# Patient Record
Sex: Male | Born: 1955 | Race: Black or African American | Hispanic: No | Marital: Single | Smoking: Former smoker
Health system: Southern US, Community
[De-identification: ages and names within clinical notes are randomized; demographics above are authoritative.]

## PROBLEM LIST (undated history)

## (undated) DIAGNOSIS — K2100 Gastro-esophageal reflux disease with esophagitis, without bleeding: Secondary | ICD-10-CM

## (undated) DIAGNOSIS — R222 Localized swelling, mass and lump, trunk: Secondary | ICD-10-CM

## (undated) DIAGNOSIS — G35 Multiple sclerosis: Secondary | ICD-10-CM

## (undated) DIAGNOSIS — J189 Pneumonia, unspecified organism: Secondary | ICD-10-CM

## (undated) DIAGNOSIS — I10 Essential (primary) hypertension: Secondary | ICD-10-CM

## (undated) DIAGNOSIS — D649 Anemia, unspecified: Secondary | ICD-10-CM

## (undated) DIAGNOSIS — G35D Multiple sclerosis, unspecified: Secondary | ICD-10-CM

## (undated) DIAGNOSIS — Z22322 Carrier or suspected carrier of Methicillin resistant Staphylococcus aureus: Secondary | ICD-10-CM

## (undated) DIAGNOSIS — R609 Edema, unspecified: Secondary | ICD-10-CM

## (undated) DIAGNOSIS — R042 Hemoptysis: Secondary | ICD-10-CM

## (undated) HISTORY — DX: Anemia, unspecified: D64.9

## (undated) HISTORY — DX: Localized swelling, mass and lump, trunk: R22.2

## (undated) HISTORY — DX: Hemoptysis: R04.2

## (undated) HISTORY — DX: Carrier or suspected carrier of methicillin resistant Staphylococcus aureus: Z22.322

## (undated) HISTORY — DX: Edema, unspecified: R60.9

## (undated) HISTORY — DX: Gastro-esophageal reflux disease with esophagitis: K21.0

## (undated) HISTORY — DX: Gastro-esophageal reflux disease with esophagitis, without bleeding: K21.00

## (undated) HISTORY — DX: Pneumonia, unspecified organism: J18.9

## (undated) HISTORY — PX: OTHER SURGICAL HISTORY: SHX169

## (undated) HISTORY — DX: Essential (primary) hypertension: I10

---

## 2012-08-04 ENCOUNTER — Encounter (HOSPITAL_COMMUNITY): Payer: Self-pay | Admitting: Emergency Medicine

## 2012-08-04 ENCOUNTER — Emergency Department (HOSPITAL_COMMUNITY): Payer: BC Managed Care – PPO

## 2012-08-04 ENCOUNTER — Inpatient Hospital Stay (HOSPITAL_COMMUNITY)
Admission: EM | Admit: 2012-08-04 | Discharge: 2012-08-14 | DRG: 541 | Disposition: A | Discharge status: Skilled Nursing Facility | Payer: BC Managed Care – PPO | Attending: Internal Medicine | Admitting: Internal Medicine

## 2012-08-04 DIAGNOSIS — Z79899 Other long term (current) drug therapy: Secondary | ICD-10-CM

## 2012-08-04 DIAGNOSIS — D473 Essential (hemorrhagic) thrombocythemia: Secondary | ICD-10-CM | POA: Diagnosis present

## 2012-08-04 DIAGNOSIS — D509 Iron deficiency anemia, unspecified: Secondary | ICD-10-CM | POA: Diagnosis present

## 2012-08-04 DIAGNOSIS — G35 Multiple sclerosis: Secondary | ICD-10-CM | POA: Diagnosis present

## 2012-08-04 DIAGNOSIS — Z681 Body mass index (BMI) 19 or less, adult: Secondary | ICD-10-CM

## 2012-08-04 DIAGNOSIS — I959 Hypotension, unspecified: Secondary | ICD-10-CM | POA: Diagnosis present

## 2012-08-04 DIAGNOSIS — R64 Cachexia: Secondary | ICD-10-CM | POA: Diagnosis present

## 2012-08-04 DIAGNOSIS — E871 Hypo-osmolality and hyponatremia: Secondary | ICD-10-CM

## 2012-08-04 DIAGNOSIS — D649 Anemia, unspecified: Secondary | ICD-10-CM

## 2012-08-04 DIAGNOSIS — R05 Cough: Secondary | ICD-10-CM

## 2012-08-04 DIAGNOSIS — J189 Pneumonia, unspecified organism: Principal | ICD-10-CM

## 2012-08-04 DIAGNOSIS — R600 Localized edema: Secondary | ICD-10-CM

## 2012-08-04 DIAGNOSIS — R222 Localized swelling, mass and lump, trunk: Secondary | ICD-10-CM | POA: Diagnosis present

## 2012-08-04 DIAGNOSIS — R042 Hemoptysis: Secondary | ICD-10-CM

## 2012-08-04 DIAGNOSIS — J9600 Acute respiratory failure, unspecified whether with hypoxia or hypercapnia: Secondary | ICD-10-CM | POA: Diagnosis present

## 2012-08-04 DIAGNOSIS — Z87891 Personal history of nicotine dependence: Secondary | ICD-10-CM

## 2012-08-04 DIAGNOSIS — J984 Other disorders of lung: Secondary | ICD-10-CM

## 2012-08-04 DIAGNOSIS — R059 Cough, unspecified: Secondary | ICD-10-CM

## 2012-08-04 DIAGNOSIS — D72829 Elevated white blood cell count, unspecified: Secondary | ICD-10-CM | POA: Diagnosis present

## 2012-08-04 DIAGNOSIS — Y95 Nosocomial condition: Secondary | ICD-10-CM | POA: Diagnosis present

## 2012-08-04 DIAGNOSIS — Z880 Allergy status to penicillin: Secondary | ICD-10-CM

## 2012-08-04 DIAGNOSIS — G35D Multiple sclerosis, unspecified: Secondary | ICD-10-CM

## 2012-08-04 DIAGNOSIS — E8809 Other disorders of plasma-protein metabolism, not elsewhere classified: Secondary | ICD-10-CM | POA: Diagnosis present

## 2012-08-04 HISTORY — DX: Multiple sclerosis, unspecified: G35.D

## 2012-08-04 HISTORY — DX: Multiple sclerosis: G35

## 2012-08-04 NOTE — ED Notes (Signed)
Patient suffered from hyperthermia in January. The patient has had swelling and a pinched nerve to his back since. The patient reports that he has SOB when lying down.

## 2012-08-04 NOTE — ED Provider Notes (Signed)
History     CSN: [id number]  Arrival date & time 08/04/12  2154   First MD Initiated Contact with Patient 08/04/12 2324      Chief Complaint  Patient presents with  . Cough  . Leg Swelling    (Consider location/radiation/quality/duration/timing/severity/associated sxs/prior treatment) HPI Patient presents emergency department complaining of worsening cough over the past several months.  His cough began in December 2013.  He was hospitalized in outside hospital for community acquired pneumonia as well as an MS exacerbation with associated hypothermia.  He was hospitalized for several weeks.  He came up to [facility] to live with his family.  The patient was brought to the emergency department today by his son for worsening cough over the past 24 hours.  Chills without documented fever.  Decreased oral intake over the past several weeks.  Weight loss.  No outside records are available.  Patient also reports that his lower extremities are swollen.  His legs have been swollen for the past several months as well.  Family does not know diagnosis for why this is.   History reviewed. No pertinent past medical history.  History reviewed. No pertinent past surgical history.  History reviewed. No pertinent family history.  History  Substance Use Topics  . Smoking status: Former Smoker    Quit date: 06/06/2012  . Smokeless tobacco: Not on file  . Alcohol Use: No      Review of Systems  All other systems reviewed and are negative.    Allergies  Penicillins  Home Medications   Current Outpatient Rx  Name  Route  Sig  Dispense  Refill  . amLODipine (NORVASC) 5 MG tablet   Oral   Take 5 mg by mouth daily.         . furosemide (LASIX) 20 MG tablet   Oral   Take 20 mg by mouth 2 (two) times daily.         . iron polysaccharides (NIFEREX) 150 MG capsule   Oral   Take 150 mg by mouth 2 (two) times daily.         . pantoprazole (PROTONIX) 40 MG tablet   Oral   Take 40  mg by mouth daily.         . potassium chloride (K-DUR) 10 MEQ tablet   Oral   Take 10 mEq by mouth 2 (two) times daily.           BP 112/75  Pulse 121  Temp(Src) 98.9 F (37.2 C) (Oral)  Resp 25  SpO2 91%  Physical Exam  Nursing note and vitals reviewed. Constitutional: He is oriented to person, place, and time. He appears well-developed and well-nourished.  Cachectic  HENT:  Head: Normocephalic and atraumatic.  Eyes: EOM are normal.  Neck: Normal range of motion.  Cardiovascular: Normal rate, regular rhythm, normal heart sounds and intact distal pulses.   Pulmonary/Chest: Effort normal and breath sounds normal. No respiratory distress.  Abdominal: Soft. He exhibits no distension. There is no tenderness.  Musculoskeletal: Normal range of motion.  3+ bilateral lower extremity edema without tenderness or erythema  Neurological: He is alert and oriented to person, place, and time.  Skin: Skin is warm and dry.  Psychiatric: He has a normal mood and affect. Judgment normal.    ED Course  Procedures (including critical care time)   Date: 08/04/2012  Rate: 115  Rhythm:sinus tachycardia  QRS Axis: normal  Intervals: normal  ST/T Wave abnormalities: normal  Conduction Disutrbances: none  Narrative Interpretation:   Old EKG Reviewed: No significant changes noted     Labs Reviewed  BASIC METABOLIC PANEL - Abnormal; Notable for the following:    Sodium 131 (*)    Potassium 5.7 (*)    Chloride 92 (*)    Glucose, Bld 129 (*)    All other components within normal limits  CBC - Abnormal; Notable for the following:    WBC 12.9 (*)    RBC 3.76 (*)    Hemoglobin 8.7 (*)    HCT 29.1 (*)    MCV 77.4 (*)    MCH 23.1 (*)    MCHC 29.9 (*)    RDW 18.0 (*)    Platelets 587 (*)    All other components within normal limits  PRO B NATRIURETIC PEPTIDE - Abnormal; Notable for the following:    Pro B Natriuretic peptide (BNP) 377.7 (*)    All other components within normal  limits  CULTURE, BLOOD (ROUTINE X 2)  CULTURE, BLOOD (ROUTINE X 2)  TROPONIN I  VITAMIN B12  FOLATE  IRON AND TIBC  FERRITIN  RETICULOCYTES  URINALYSIS, ROUTINE W REFLEX MICROSCOPIC   Dg Chest 2 View (if Patient Has Fever And/or Copd)  08/04/2012  *RADIOLOGY REPORT*  Clinical Data: Cough, congestion, leg swelling.  CHEST - 2 VIEW  Comparison: None.  Findings: There is a large opacity in the left lower lung probably representing consolidation due to pneumonia.  Underlying mass lesion is not excluded and follow up after resolution of acute symptoms is recommended.  The heart size appears normal but is obscured by the left parenchymal process.  The right lung appears clear and expanded.  No blunting of costophrenic angles.  No pneumothorax. Scarring and emphysematous changes in the lung apices.  IMPRESSION: Large opacity in the left lower lung likely to represent consolidation due to pneumonia.  Follow up after resolution of acute symptoms is recommended to exclude underlying mass lesion.   Original Report Authenticated By: Burman Nieves, M.D.    I personally reviewed the imaging tests through PACS system I reviewed available ER/hospitalization records through the EMR    1. HCAP (healthcare-associated pneumonia)   2. Cough       MDM  Concern is what appears to be a healthcare associated pneumonia as well as what appears to be a left lung mass.  The patient will get a CT scan this time.  Admission the hospital.  His hemoglobin is 8.7.  As best I can tell the patient's been anemic before as he is on iron supplementation at home.  We have no prior labs on this patient.  The patient was treated for community acquired pneumonia in January.  Given the finding today this will represent healthcare associated pneumonia.  His heart rate on arrival was 121.  His potassium is 5.7 with normal BUN and creatinine.  I suspect this is a small cyst.  Repeat potassium pending.      [phone] and  [phone] Markus Casten) [phone] (Dyron Hodges- Son)  Lyanne Co, MD 08/05/12 [phone]

## 2012-08-05 ENCOUNTER — Emergency Department (HOSPITAL_COMMUNITY): Payer: BC Managed Care – PPO

## 2012-08-05 ENCOUNTER — Encounter (HOSPITAL_COMMUNITY): Payer: Self-pay

## 2012-08-05 DIAGNOSIS — R55 Syncope and collapse: Secondary | ICD-10-CM

## 2012-08-05 DIAGNOSIS — J189 Pneumonia, unspecified organism: Principal | ICD-10-CM

## 2012-08-05 DIAGNOSIS — R609 Edema, unspecified: Secondary | ICD-10-CM

## 2012-08-05 DIAGNOSIS — E871 Hypo-osmolality and hyponatremia: Secondary | ICD-10-CM | POA: Diagnosis present

## 2012-08-05 DIAGNOSIS — R600 Localized edema: Secondary | ICD-10-CM | POA: Diagnosis present

## 2012-08-05 DIAGNOSIS — G35 Multiple sclerosis: Secondary | ICD-10-CM | POA: Diagnosis present

## 2012-08-05 DIAGNOSIS — R042 Hemoptysis: Secondary | ICD-10-CM | POA: Diagnosis present

## 2012-08-05 DIAGNOSIS — R05 Cough: Secondary | ICD-10-CM

## 2012-08-05 DIAGNOSIS — R059 Cough, unspecified: Secondary | ICD-10-CM

## 2012-08-05 DIAGNOSIS — D649 Anemia, unspecified: Secondary | ICD-10-CM

## 2012-08-05 LAB — BASIC METABOLIC PANEL
CO2: 28 mEq/L (ref 19–32)
Glucose, Bld: 129 mg/dL — ABNORMAL HIGH (ref 70–99)
Potassium: 5.7 mEq/L — ABNORMAL HIGH (ref 3.5–5.1)
Sodium: 131 mEq/L — ABNORMAL LOW (ref 135–145)

## 2012-08-05 LAB — COMPREHENSIVE METABOLIC PANEL
AST: 8 U/L (ref 0–37)
Albumin: 1.8 g/dL — ABNORMAL LOW (ref 3.5–5.2)
Alkaline Phosphatase: 91 U/L (ref 39–117)
CO2: 28 mEq/L (ref 19–32)
Chloride: 95 mEq/L — ABNORMAL LOW (ref 96–112)
Potassium: 3.7 mEq/L (ref 3.5–5.1)
Total Bilirubin: 0.3 mg/dL (ref 0.3–1.2)

## 2012-08-05 LAB — CBC WITH DIFFERENTIAL/PLATELET
Basophils Absolute: 0 10*3/uL (ref 0.0–0.1)
Basophils Relative: 0 % (ref 0–1)
Eosinophils Absolute: 0 10*3/uL (ref 0.0–0.7)
Eosinophils Relative: 0 % (ref 0–5)
MCH: 23.4 pg — ABNORMAL LOW (ref 26.0–34.0)
MCHC: 29.8 g/dL — ABNORMAL LOW (ref 30.0–36.0)
MCV: 78.5 fL (ref 78.0–100.0)
Platelets: 646 10*3/uL — ABNORMAL HIGH (ref 150–400)
RDW: 17.8 % — ABNORMAL HIGH (ref 11.5–15.5)

## 2012-08-05 LAB — IRON AND TIBC
Iron: <10 ug/dL — ABNORMAL LOW (ref 42–135)
UIBC: 161 ug/dL (ref 125–400)

## 2012-08-05 LAB — EXPECTORATED SPUTUM ASSESSMENT W GRAM STAIN, RFLX TO RESP C: Special Requests: NORMAL

## 2012-08-05 LAB — CBC
HCT: 29.1 % — ABNORMAL LOW (ref 39.0–52.0)
Hemoglobin: 8.7 g/dL — ABNORMAL LOW (ref 13.0–17.0)
MCH: 23.1 pg — ABNORMAL LOW (ref 26.0–34.0)
MCV: 77.4 fL — ABNORMAL LOW (ref 78.0–100.0)
RBC: 3.76 MIL/uL — ABNORMAL LOW (ref 4.22–5.81)

## 2012-08-05 LAB — URINALYSIS, ROUTINE W REFLEX MICROSCOPIC
Bilirubin Urine: NEGATIVE
Glucose, UA: NEGATIVE mg/dL
Ketones, ur: NEGATIVE mg/dL
Leukocytes, UA: NEGATIVE
Specific Gravity, Urine: >1.046 — ABNORMAL HIGH (ref 1.005–1.030)
pH: 6 (ref 5.0–8.0)

## 2012-08-05 LAB — CREATININE, URINE, RANDOM: Creatinine, Urine: 176.56 mg/dL

## 2012-08-05 LAB — BILIRUBIN, DIRECT: Bilirubin, Direct: 0.1 mg/dL (ref 0.0–0.3)

## 2012-08-05 LAB — STREP PNEUMONIAE URINARY ANTIGEN: Strep Pneumo Urinary Antigen: NEGATIVE

## 2012-08-05 LAB — FERRITIN: Ferritin: 737 ng/mL — ABNORMAL HIGH (ref 22–322)

## 2012-08-05 LAB — HIV ANTIBODY (ROUTINE TESTING W REFLEX): HIV: NONREACTIVE

## 2012-08-05 MED ORDER — DEXTROSE 5 % IV SOLN
500.0000 mg | Freq: Once | INTRAVENOUS | Status: AC
  Administered 2012-08-05: 500 mg via INTRAVENOUS
  Filled 2012-08-05: qty 500

## 2012-08-05 MED ORDER — ENOXAPARIN SODIUM 40 MG/0.4ML ~~LOC~~ SOLN
40.0000 mg | SUBCUTANEOUS | Status: DC
  Administered 2012-08-06 – 2012-08-10 (×6): 40 mg via SUBCUTANEOUS
  Filled 2012-08-05 (×11): qty 0.4

## 2012-08-05 MED ORDER — ENOXAPARIN SODIUM 40 MG/0.4ML ~~LOC~~ SOLN
40.0000 mg | SUBCUTANEOUS | Status: DC
  Filled 2012-08-05: qty 0.4

## 2012-08-05 MED ORDER — VANCOMYCIN HCL IN DEXTROSE 1-5 GM/200ML-% IV SOLN
1000.0000 mg | Freq: Two times a day (BID) | INTRAVENOUS | Status: DC
  Administered 2012-08-05 – 2012-08-08 (×6): 1000 mg via INTRAVENOUS
  Filled 2012-08-05 (×8): qty 200

## 2012-08-05 MED ORDER — BENZONATATE 100 MG PO CAPS
200.0000 mg | ORAL_CAPSULE | Freq: Three times a day (TID) | ORAL | Status: DC
  Administered 2012-08-05 – 2012-08-08 (×9): 200 mg via ORAL
  Filled 2012-08-05 (×13): qty 2

## 2012-08-05 MED ORDER — PIPERACILLIN-TAZOBACTAM 3.375 G IVPB
3.3750 g | Freq: Three times a day (TID) | INTRAVENOUS | Status: DC
  Administered 2012-08-05 – 2012-08-12 (×23): 3.375 g via INTRAVENOUS
  Filled 2012-08-05 (×25): qty 50

## 2012-08-05 MED ORDER — POLYSACCHARIDE IRON COMPLEX 150 MG PO CAPS
150.0000 mg | ORAL_CAPSULE | Freq: Two times a day (BID) | ORAL | Status: DC
  Administered 2012-08-05 – 2012-08-14 (×18): 150 mg via ORAL
  Filled 2012-08-05 (×22): qty 1

## 2012-08-05 MED ORDER — ADULT MULTIVITAMIN W/MINERALS CH
1.0000 | ORAL_TABLET | Freq: Every day | ORAL | Status: DC
  Administered 2012-08-05: 1 via ORAL
  Filled 2012-08-05 (×2): qty 1

## 2012-08-05 MED ORDER — DM-GUAIFENESIN ER 30-600 MG PO TB12
1.0000 | ORAL_TABLET | Freq: Two times a day (BID) | ORAL | Status: DC
  Administered 2012-08-05 – 2012-08-08 (×5): 1 via ORAL
  Filled 2012-08-05 (×9): qty 1

## 2012-08-05 MED ORDER — PIPERACILLIN-TAZOBACTAM 3.375 G IVPB
3.3750 g | Freq: Once | INTRAVENOUS | Status: AC
  Administered 2012-08-05: 3.375 g via INTRAVENOUS
  Filled 2012-08-05: qty 50

## 2012-08-05 MED ORDER — IOHEXOL 350 MG/ML SOLN
100.0000 mL | Freq: Once | INTRAVENOUS | Status: AC | PRN
Start: 2012-08-05 — End: 2012-08-05
  Administered 2012-08-05: 100 mL via INTRAVENOUS

## 2012-08-05 MED ORDER — AZITHROMYCIN 250 MG PO TABS
250.0000 mg | ORAL_TABLET | Freq: Every day | ORAL | Status: DC
  Filled 2012-08-05: qty 1

## 2012-08-05 MED ORDER — ENSURE COMPLETE PO LIQD
237.0000 mL | Freq: Three times a day (TID) | ORAL | Status: DC
  Administered 2012-08-05 – 2012-08-14 (×20): 237 mL via ORAL

## 2012-08-05 MED ORDER — VANCOMYCIN HCL IN DEXTROSE 1-5 GM/200ML-% IV SOLN
1000.0000 mg | Freq: Once | INTRAVENOUS | Status: AC
  Administered 2012-08-05: 1000 mg via INTRAVENOUS
  Filled 2012-08-05: qty 200

## 2012-08-05 MED ORDER — ALBUTEROL SULFATE (5 MG/ML) 0.5% IN NEBU
5.0000 mg | INHALATION_SOLUTION | Freq: Once | RESPIRATORY_TRACT | Status: AC
  Administered 2012-08-05: 5 mg via RESPIRATORY_TRACT
  Filled 2012-08-05: qty 1

## 2012-08-05 MED ORDER — PANTOPRAZOLE SODIUM 40 MG PO TBEC
40.0000 mg | DELAYED_RELEASE_TABLET | Freq: Every day | ORAL | Status: DC
  Administered 2012-08-05: 40 mg via ORAL
  Filled 2012-08-05 (×2): qty 1

## 2012-08-05 NOTE — Progress Notes (Signed)
Pt with crackles noted vs taken and pt assist to chair. Dr Malachi Bonds informed and new orders noted.

## 2012-08-05 NOTE — Progress Notes (Addendum)
[facility] PROGRESS NOTE  Treyveon Mochizuki UJW:[id number] DOB: May 15, 1955 DOA: 08/04/2012 PCP: No primary provider on file.  Mr. Slomski is a 57 yo M with MS who presents with cough and a 50-lb weight loss.  CT chest demonstrated a large left lung mass and PNA.  He was seen by PCCM and will undergo bronchoscopic biopsy on 4/2.    Assessment/Plan  Acute hypoxic respiratory failure with SOB:  Likely a combination of enlarging mass and/or pneumonia.  Was started on broad spectrum antibiotics and nasal canula overnight and feeling somewhat better this morning.  CT neg for PE.  Health care associated pneumonia (hospital-acquired pneumonia) vs. Post-obstructive pneumonia -  Continue vancomycin and zosyn -  Add atypical coverage with azithromycin -  F/u urine legionella and s. pneumo ag -  Sputum culture -  F/u blood cultures  Lung mass, large and without significant mediastinal/hilar LAD.  Patient aware that he may have cancer.   -  Pulmonary consult -  Patient ate breakfast this morning but has subsequently been made NPO.    Leg edema, severe, likely due to hypoalbuminemia 2/2 malignancy-associated wasting, but in setting of malignancy, agree with ruling out DVT -  Nutrition consult when starting diet again -  Dietary supplements  -  ECHO pending -  UA for proteinuria -  Lower extremity duplex -  Elevate lower extremities -  TED hose when able -  Add SCDs in the mean time  Hyponatremia - May be mildly intravascularly deplete or have some heart failure, however, I suspect he may have some SIADH from his pulmonary issues -  ECHO  -  LFTs wnl -  TSH pending -  Gentle hydration -  Urine osms and sodium  Leukocytosis:  May be due to malignancy or pneumonia.  Trending down -  Trend  Microcytic anemia -  F/u anemia panel  -  Hemoccult stool   Thrombocytosis:  May be due to malignancy or pneumonia.  Trending up -  Trend  Multiple sclerosis - patient unable to provide much  history regarding this. -  Will obtain more information from family  Diet:  NPO (ate breakfast this morning) Access:  PIV IVF:  OFF Proph:  lovenox to start post-procedure, SCDs now  Code Status: full code Family Communication: spoke with patient alone Disposition Plan: pending medically stable.  PT consult prior to discharge.     Consultants:  pulmonary  Procedures:  CTa chest  Antibiotics:  Vancomycin 4/1 >>  Zosyn 4/1 >>  Azithro 4/1 >>   HPI/Subjective:  Denies fevers, chills, headache.  Denies chest pain.  SOB is better since starting oxygen.  Has not been exerting himself to get OOB this morning.  Denies nausea, vomiting, diarrhea,constipation.  Denies pain.  Has some cough not productive of blood currently.  Objective: Filed Vitals:   08/05/12 0115 08/05/12 0211 08/05/12 0238 08/05/12 0542  BP: 105/67 105/62 95/63 99/65   Pulse: 107 91 107 92  Temp:   98.1 F (36.7 C) 98.2 F (36.8 C)  TempSrc:   Oral Oral  Resp:  18 18 18   Height:   6\' 1"  (1.854 m)   Weight:   59.875 kg (132 lb)   SpO2: 91% 96% 95% 100%    Intake/Output Summary (Last 24 hours) at 08/05/12 0927 Last data filed at 08/05/12 0900  Gross per 24 hour  Intake    240 ml  Output    250 ml  Net    -10 ml   Ceasar Mons  Weights   08/05/12 0238  Weight: 59.875 kg (132 lb)    Exam:   General:  [name], No acute distress  HEENT:  NCAT, MMM  Cardiovascular:  RRR, nl S1, S2 no mrg, 2+ pulses, warm extremities  Respiratory:  Diminished BS at the left base to the left mid-back.  CTA on the right.  No wheezes, rales, or rhonchi.  No increased WOB while on nasal canula.   Abdomen:   NABS, soft, NT/ND  MSK:   Normal tone and bulk, 3+ bilateral ankle edema.    Neuro:  Grossly intact  Data Reviewed: Basic Metabolic Panel:  Recent Labs Lab 08/04/12 2349 08/05/12 0130 08/05/12 0515  NA 131*  --  134*  K 5.7* 3.8 3.7  CL 92*  --  95*  CO2 28  --  28  GLUCOSE 129*  --  109*  BUN 14   --  11  CREATININE 0.54  --  0.63  CALCIUM 8.8  --  8.6   Liver Function Tests:  Recent Labs Lab 08/05/12 0515  AST 8  ALT <5  ALKPHOS 91  BILITOT 0.3  PROT 6.8  ALBUMIN 1.8*   No results found for this basename: LIPASE, AMYLASE,  in the last 168 hours No results found for this basename: AMMONIA,  in the last 168 hours CBC:  Recent Labs Lab 08/04/12 2349 08/05/12 0515  WBC 12.9* 12.3*  NEUTROABS  --  9.4*  HGB 8.7* 8.7*  HCT 29.1* 29.2*  MCV 77.4* 78.5  PLT 587* 646*   Cardiac Enzymes:  Recent Labs Lab 08/05/12 0130  TROPONINI <0.30   BNP (last 3 results)  Recent Labs  08/04/12 2349  PROBNP 377.7*   CBG: No results found for this basename: GLUCAP,  in the last 168 hours  No results found for this or any previous visit (from the past 240 hour(s)).   Studies: Dg Chest 2 View (if Patient Has Fever And/or Copd)  08/04/2012  *RADIOLOGY REPORT*  Clinical Data: Cough, congestion, leg swelling.  CHEST - 2 VIEW  Comparison: None.  Findings: There is a large opacity in the left lower lung probably representing consolidation due to pneumonia.  Underlying mass lesion is not excluded and follow up after resolution of acute symptoms is recommended.  The heart size appears normal but is obscured by the left parenchymal process.  The right lung appears clear and expanded.  No blunting of costophrenic angles.  No pneumothorax. Scarring and emphysematous changes in the lung apices.  IMPRESSION: Large opacity in the left lower lung likely to represent consolidation due to pneumonia.  Follow up after resolution of acute symptoms is recommended to exclude underlying mass lesion.   Original Report Authenticated By: Burman Nieves, M.D.    Ct Angio Chest W/cm &/or Wo Cm  08/05/2012  *RADIOLOGY REPORT*  Clinical Data: Leg swelling  CT ANGIOGRAPHY CHEST  Technique:  Multidetector CT imaging of the chest using the standard protocol during bolus administration of intravenous contrast.  Multiplanar reconstructed images including MIPs were obtained and reviewed to evaluate the vascular anatomy.  Contrast:  100 ml Omnipaque 350  Comparison: None.  Findings: There are no filling defects in the pulmonary arterial tree to suggest acute pulmonary thromboembolism.  There is mass-like consolidation extending from the left inferior hilum towards the left lung base.  There is abrupt occlusion of the left lower lobe airways either due to endobronchial mass or mucous plugging.  There is a large 10.1 x 6.6 by  8.9 cm rounded fluid collection within the left lower lobe.  There is scattered airspace disease in the right middle lobe, right lower lobe, and left upper lobe.  Bronchiectasis at the right lung base is noted.  There is no abnormal mediastinal adenopathy by measurement criteria.  Sub centimeter Antaniya Venuti axis diameter nodes are noted.  The patient is cachectic.  Bullous changes at the lung apices are noted.  No destructive bone lesion.  The liver is diffusely heterogeneous in enhancement.  Moderate left pleural effusion is present.  T8 superior end plate depression fracture is of indeterminate age.  IMPRESSION: No evidence of acute pulmonary thromboembolism.  Mass-like consolidation in the left lower lobe.  Left lower lobe air placed abruptly and either due to mucous plugging or endobronchial lesion.  Bronchoscopy may be helpful.  Large loculated fluid collection in the left lower lobe of unknown significance.  Patchy consolidation throughout the other lobes.  Left pleural effusion.  Heterogeneous enhancement in the liver which may simply be due to phase of contrast.  Diffuse infiltration with tumor cannot be excluded.  Mild superior T8 compression fracture of indeterminate age.   Original Report Authenticated By: Jolaine Click, M.D.     Scheduled Meds: . enoxaparin (LOVENOX) injection  40 mg Subcutaneous Q24H  . iron polysaccharides  150 mg Oral BID  . pantoprazole  40 mg Oral Daily  .  piperacillin-tazobactam (ZOSYN)  IV  3.375 g Intravenous Q8H  . vancomycin  1,000 mg Intravenous Q12H   Continuous Infusions:   Active Problems:   Leg edema   Hyponatremia   Anemia   HAP (hospital-acquired pneumonia)   Multiple sclerosis   Hemoptysis    Time spent: 30 min    Raziyah Vanvleck, [facility]  [facility] Pager [phone]. If 7PM-7AM, please contact night-coverage at [email or web address], password [facility] 08/05/2012, 9:27 AM  LOS: 1 day

## 2012-08-05 NOTE — ED Notes (Signed)
Pt is aware of the need for urine, however states he is unable at this time. Urinal at bedside.

## 2012-08-05 NOTE — Progress Notes (Signed)
Rx Brief note Pt PCN allergic unknown rxn from 1960s.  Spoke with RN Leotis Shames- she spoke with Dr Patria Mane he wants to proceed with Zosyn based on allergy info.  Told Lauren to watch pt carefully.  Lorenza Evangelist 08/05/2012 12:25 AM

## 2012-08-05 NOTE — H&P (Signed)
PCP: none   Chief Complaint:   cough  HPI: Devon Glenn is a 57 y.o. male   has a past medical history of Multiple sclerosis.   Presented with  Patient have been coughing for the past 2-3 weeks that is non-productive. He has had some weight loss 50 LB weight loss. He has been recently hospitalized due to PNA and hypothermia in [place]. Per ER son told him that he have had a thoracentesis while at the hospital.  Now moved to live with is son. He reported last week he had a small amount of blood in his sputum. Denies any fever but her have had some chills. No night sweats. He never been homeless. Denies any exposure to TB no significant travel hx. He has had extensive smoking history but states currently ha quit. CXR is worrisome for PNA vs Mass.  Denies any chest pain or shortness of breath. Per family he had had leg swelling for the past 3 months since December that has been unchanged. He denies any history of heart failure or liver disease. CT of the chest has been ordered and hospitalist was called on admission. ER have spoken to the family and the patient about the possibility that there is a mass in his chest.  The patient has trouble to me detailed history no family is at bedside.  Review of Systems:     Pertinent positives include: chills, fatigue, weight loss non-productive cough, coughing up of blood.  Constitutional:  No weight loss, night sweats, Fevers,  HEENT:  No headaches, Difficulty swallowing,Tooth/dental problems,Sore throat,  No sneezing, itching, ear ache, nasal congestion, post nasal drip,  Cardio-vascular:  No chest pain, Orthopnea, PND, anasarca, dizziness, palpitations.no Bilateral lower extremity swelling  GI:  No heartburn, indigestion, abdominal pain, nausea, vomiting, diarrhea, change in bowel habits, loss of appetite, melena, blood in stool, hematemesis Resp:  no shortness of breath at rest. No dyspnea on exertion, No excess mucus, no productive  cough,   No change in color of mucus.No wheezing. Skin:  no rash or lesions. No jaundice GU:  no dysuria, change in color of urine, no urgency or frequency. No straining to urinate.  No flank pain.  Musculoskeletal:  No joint pain or no joint swelling. No decreased range of motion. No back pain.  Psych:  No change in mood or affect. No depression or anxiety. No memory loss.  Neuro: no localizing neurological complaints, no tingling, no weakness, no double vision, no gait abnormality, no slurred speech, no confusion  Otherwise ROS are negative except for above, 10 systems were reviewed  Past Medical History: Past Medical History  Diagnosis Date  . Multiple sclerosis    Past Surgical History  Procedure Laterality Date  . Lipoma removal       Medications: Prior to Admission medications   Medication Sig Start Date End Date Taking? Authorizing Provider  amLODipine (NORVASC) 5 MG tablet Take 5 mg by mouth daily.   Yes Historical Provider, MD  furosemide (LASIX) 20 MG tablet Take 20 mg by mouth 2 (two) times daily.   Yes Historical Provider, MD  iron polysaccharides (NIFEREX) 150 MG capsule Take 150 mg by mouth 2 (two) times daily.   Yes Historical Provider, MD  pantoprazole (PROTONIX) 40 MG tablet Take 40 mg by mouth daily.   Yes Historical Provider, MD  potassium chloride (K-DUR) 10 MEQ tablet Take 10 mEq by mouth 2 (two) times daily.   Yes Historical Provider, MD    Allergies:  Allergies  Allergen Reactions  . Penicillins     unknown    Social History:  Ambulatory independently   Lives at  Home with son [phone]   reports that he quit smoking about 1 months ago. He does not have any smokeless tobacco history on file. He reports that he does not drink alcohol or use illicit drugs.   Family History: family history includes Breast cancer in his mother and sister and Cancer in his father.    Physical Exam: Patient Vitals for the past 24 hrs:  BP Temp Temp src  Pulse Resp SpO2  08/04/12 2243 112/75 mmHg 98.9 F (37.2 C) Oral 121 25 91 %    1. General:  in No Acute distress cachectic appearing 2. Psychological: Alert and  Oriented 3. Head/ENT:   Dry Mucous Membranes                          Head Non traumatic, neck supple                          Normal Dentition 4. SKIN:   decreased Skin turgor,  Skin clean Dry and intact no rash 5. Heart: Rapid but Regular rate and rhythm no Murmur, Rub or gallop 6. Lungs: Occasional wheezes extensive crackles and coarse breath sounds constant cough is interfering with exam  7. Abdomen: Soft, non-tender, Non distended thin , normal bowel sounds present 8. Lower extremities: no clubbing, cyanosis, extensive 3+ edema below the knees  9. Neurologically Grossly intact, moving all 4 extremities equally 10. MSK: Normal range of motion  body mass index is unknown because there is no height or weight on file.   Labs on Admission:   Recent Labs  08/04/12 2349  NA 131*  K 5.7*  CL 92*  CO2 28  GLUCOSE 129*  BUN 14  CREATININE 0.54  CALCIUM 8.8   No results found for this basename: AST, ALT, ALKPHOS, BILITOT, PROT, ALBUMIN,  in the last 72 hours No results found for this basename: LIPASE, AMYLASE,  in the last 72 hours  Recent Labs  08/04/12 2349  WBC 12.9*  HGB 8.7*  HCT 29.1*  MCV 77.4*  PLT 587*   No results found for this basename: CKTOTAL, CKMB, CKMBINDEX, TROPONINI,  in the last 72 hours No results found for this basename: TSH, T4TOTAL, FREET3, T3FREE, THYROIDAB,  in the last 72 hours No results found for this basename: VITAMINB12, FOLATE, FERRITIN, TIBC, IRON, RETICCTPCT,  in the last 72 hours No results found for this basename: HGBA1C    CrCl is unknown because there is no height on file for the current visit. ABG No results found for this basename: phart, pco2, po2, hco3, tco2, acidbasedef, o2sat     No results found for this basename: DDIMER     Other results:  I have pearsonaly  reviewed this: ECG REPORT  Rate: 115  Rhythm: sinus tachycardia ST&T Change: no ischemic changes  K is hemolyses repeat pending  Cultures: No results found for this basename: sdes, specrequest, cult, reptstatus       Radiological Exams on Admission: Dg Chest 2 View (if Patient Has Fever And/or Copd)  08/04/2012  *RADIOLOGY REPORT*  Clinical Data: Cough, congestion, leg swelling.  CHEST - 2 VIEW  Comparison: None.  Findings: There is a large opacity in the left lower lung probably representing consolidation due to pneumonia.  Underlying mass lesion is  not excluded and follow up after resolution of acute symptoms is recommended.  The heart size appears normal but is obscured by the left parenchymal process.  The right lung appears clear and expanded.  No blunting of costophrenic angles.  No pneumothorax. Scarring and emphysematous changes in the lung apices.  IMPRESSION: Large opacity in the left lower lung likely to represent consolidation due to pneumonia.  Follow up after resolution of acute symptoms is recommended to exclude underlying mass lesion.   Original Report Authenticated By: Burman Nieves, M.D.     Chart has been reviewed  Assessment/Plan  Patient is a 57 year old gentleman who appears to be mild nourished and chronically ill presenting with cough and a chest x-ray worrisome for pneumonia versus mass. He has also had had extensive leg swelling for the past 3 months  Present on Admission:  . HAP (hospital-acquired pneumonia) - awaiting results of CAT scan to see if patient has any evidence of lung mass and/or  PE. For now will cover with hospital-acquired pneumonias he has been reportedly hospitalized recently.  . Leg edema - etiology at this point unclear will obtain albumin level, 2-D echo, and leg Dopplers  . Hyponatremia - patient does appears to be somewhat intravascularly depleted. Hyponatremic would also be secondary to chronic liver disease or heart failure. Father  workup will be needed at this point will obtain urine electrolytes.  . Anemia - will obtain anemia panel and Hemoccult stool  . Multiple sclerosis - patient unable to provide much history regarding this need to attempt to obtain records from outside facility once son comes back with some of the information.  . Hemoptysis - this was mild episode a week ago. patient has no risk factors for tuberculosis. This is likely due to pulmonary findings and is worrisome for malignancy.   Prophylaxis:   Lovenox, Protonix  CODE STATUS:FULL CODE  Other plan as per orders.  I have spent a total of 55 min on this admission  Harriette Tovey 08/05/2012, 1:10 AM

## 2012-08-05 NOTE — ED Notes (Signed)
Pt to CT at this time.

## 2012-08-05 NOTE — Progress Notes (Signed)
Spoke with Devra Dopp, FNP and Bronch will not be today so pt may have a diet.

## 2012-08-05 NOTE — Progress Notes (Signed)
  Echocardiogram 2D Echocardiogram has been performed.  Devon Glenn 08/05/2012, 8:57 AM 

## 2012-08-05 NOTE — Progress Notes (Addendum)
VASCULAR LAB PRELIMINARY  PRELIMINARY  PRELIMINARY  PRELIMINARY  Bilateral lower extremity venous duplex completed.    Preliminary report: Bilateral:  No evidence of DVT, superficial thrombosis, or Baker's Cyst. Moderate interstitial fluid noted bilaterally throughout the lower leg. There is a small area of mixed echoes in the left popliteal fossa extending into the proximal calf consistent with a ruptured Baker's cyst.   Shaniah Baltes, RVS 08/05/2012, 10:40 AM

## 2012-08-05 NOTE — Progress Notes (Signed)
Unable to collect sputum speci due to spit into tissue cup given to pt encouraged to collect in speci cup.

## 2012-08-05 NOTE — Progress Notes (Signed)
ANTIBIOTIC CONSULT NOTE - INITIAL  Pharmacy Consult for Vancomycin Indication: HAP  Allergies  Allergen Reactions  . Penicillins     unknown    Patient Measurements: Height: 6\' 1"  (185.4 cm) Weight: 132 lb (59.875 kg) IBW/kg (Calculated) : 79.9   Vital Signs: Temp: 98.1 F (36.7 C) (04/01 0238) Temp src: Oral (04/01 0238) BP: 95/63 mmHg (04/01 0238) Pulse Rate: 107 (04/01 0238) Intake/Output from previous day:   Intake/Output from this shift:    Labs:  Recent Labs  08/04/12 2349  WBC 12.9*  HGB 8.7*  PLT 587*  CREATININE 0.54   Estimated Creatinine Clearance: 85.3 ml/min (by C-G formula based on Cr of 0.54). No results found for this basename: VANCOTROUGH, VANCOPEAK, VANCORANDOM, GENTTROUGH, GENTPEAK, GENTRANDOM, TOBRATROUGH, TOBRAPEAK, TOBRARND, AMIKACINPEAK, AMIKACINTROU, AMIKACIN,  in the last 72 hours   Microbiology: No results found for this or any previous visit (from the past 720 hour(s)).  Medical History: Past Medical History  Diagnosis Date  . Multiple sclerosis     Medications:  Scheduled:  . [COMPLETED] albuterol  5 mg Nebulization Once  . enoxaparin (LOVENOX) injection  40 mg Subcutaneous Q24H  . iron polysaccharides  150 mg Oral BID  . pantoprazole  40 mg Oral Daily  . [COMPLETED] piperacillin-tazobactam (ZOSYN)  IV  3.375 g Intravenous Once  . piperacillin-tazobactam (ZOSYN)  IV  3.375 g Intravenous Q8H  . [COMPLETED] vancomycin  1,000 mg Intravenous Once   Infusions:   Assessment: 57 yo with history of MS- recently hospitalized with PNA. MD ordering Zosyn (despite PCN allergy- pt tolerated 1st dose in ER) and Vanc per RX.  Goal of Therapy:  Vancomycin trough level 15-20 mcg/ml  Plan:   Vancomycin 1Gm IV q12h.  CrCl~82 (N) using 1  Zosyn 3.375 Gm IV q8h EI infusion.  F/U SCr/levels as needed.  Devon Glenn R 08/05/2012,2:59 AM

## 2012-08-05 NOTE — Consult Note (Signed)
PULMONARY  / CRITICAL CARE MEDICINE  Name: Devon Glenn MRN: [id number] DOB: Oct 24, 1955    ADMISSION DATE:  08/04/2012 CONSULTATION DATE:  4-1  REFERRING MD :  [facility] PRIMARY SERVICE:    CHIEF COMPLAINT:  Cough, weight loss, hempotysis   SIGNIFICANT EVENTS / STUDIES:  4-1 see ct of chest report  LINES / TUBES:   CULTURES: 4-1 bc x 2>> 4-1 uc>> 4-1 sputum>>  ANTIBIOTICS: 4-1 zoysn>> 4-1 vanco>>  HISTORY OF PRESENT ILLNESS:   57 yo AAM , life long 1 ppd smoker who quit 1 month ago. He reports 50 lb weight  Loss, cough x 1 months with intermittent scant blood.  He has e=recently been treated for Pna. He denies chest pain, orthopnea. CT of chest reveals a left sided mass. Pulmonary has been asked to evaluate for possible FOB and bx of lung mass.  PAST MEDICAL HISTORY :  Past Medical History  Diagnosis Date  . Multiple sclerosis    Past Surgical History  Procedure Laterality Date  . Lipoma removal     Prior to Admission medications   Medication Sig Start Date End Date Taking? Authorizing Provider  amLODipine (NORVASC) 5 MG tablet Take 5 mg by mouth daily.   Yes Historical Provider, MD  furosemide (LASIX) 20 MG tablet Take 20 mg by mouth 2 (two) times daily.   Yes Historical Provider, MD  iron polysaccharides (NIFEREX) 150 MG capsule Take 150 mg by mouth 2 (two) times daily.   Yes Historical Provider, MD  pantoprazole (PROTONIX) 40 MG tablet Take 40 mg by mouth daily.   Yes Historical Provider, MD  potassium chloride (K-DUR) 10 MEQ tablet Take 10 mEq by mouth 2 (two) times daily.   Yes Historical Provider, MD   Allergies  Allergen Reactions  . Penicillins     unknown    FAMILY HISTORY:  Family History  Problem Relation Age of Onset  . Breast cancer Mother   . Cancer Father   . Breast cancer Sister    SOCIAL HISTORY:  reports that he quit smoking about 1 months ago. He has never used smokeless tobacco. He reports that he does not drink alcohol or use illicit  drugs.  REVIEW OF SYSTEMS:   10 point review of system taken, please see HPI for positives and negatives. .  SUBJECTIVE:  Nad at rest   VITAL SIGNS: Temp:  [98.1 F (36.7 C)-98.9 F (37.2 C)] 98.2 F (36.8 C) (04/01 0542) Pulse Rate:  [91-121] 92 (04/01 0542) Resp:  [18-25] 18 (04/01 0542) BP: (95-128)/(62-78) 99/65 mmHg (04/01 0542) SpO2:  [89 %-100 %] 100 % (04/01 0542) Weight:  [59.875 kg (132 lb)] 59.875 kg (132 lb) (04/01 0238)  PHYSICAL EXAMINATION: General:  Thin and wasted. + tremor, speech has odd cadence. Neuro:  Tremor, slow speech HEENT:  No LAN Neck:  No jvd Cardiovascular:  Hsr Lungs:  Decreased in bases left > right Abdomen:  Soft +bs Musculoskeletal:  intact Skin:  +lower ext edema   Recent Labs Lab 08/04/12 2349 08/05/12 0130 08/05/12 0515  NA 131*  --  134*  K 5.7* 3.8 3.7  CL 92*  --  95*  CO2 28  --  28  BUN 14  --  11  CREATININE 0.54  --  0.63  GLUCOSE 129*  --  109*    Recent Labs Lab 08/04/12 2349 08/05/12 0515  HGB 8.7* 8.7*  HCT 29.1* 29.2*  WBC 12.9* 12.3*  PLT 587* 646*   Dg Chest 2  View (if Patient Has Fever And/or Copd)  08/04/2012  *RADIOLOGY REPORT*  Clinical Data: Cough, congestion, leg swelling.  CHEST - 2 VIEW  Comparison: None.  Findings: There is a large opacity in the left lower lung probably representing consolidation due to pneumonia.  Underlying mass lesion is not excluded and follow up after resolution of acute symptoms is recommended.  The heart size appears normal but is obscured by the left parenchymal process.  The right lung appears clear and expanded.  No blunting of costophrenic angles.  No pneumothorax. Scarring and emphysematous changes in the lung apices.  IMPRESSION: Large opacity in the left lower lung likely to represent consolidation due to pneumonia.  Follow up after resolution of acute symptoms is recommended to exclude underlying mass lesion.   Original Report Authenticated By: Burman Nieves, M.D.     Ct Angio Chest W/cm &/or Wo Cm  08/05/2012  *RADIOLOGY REPORT*  Clinical Data: Leg swelling  CT ANGIOGRAPHY CHEST  Technique:  Multidetector CT imaging of the chest using the standard protocol during bolus administration of intravenous contrast. Multiplanar reconstructed images including MIPs were obtained and reviewed to evaluate the vascular anatomy.  Contrast:  100 ml Omnipaque 350  Comparison: None.  Findings: There are no filling defects in the pulmonary arterial tree to suggest acute pulmonary thromboembolism.  There is mass-like consolidation extending from the left inferior hilum towards the left lung base.  There is abrupt occlusion of the left lower lobe airways either due to endobronchial mass or mucous plugging.  There is a large 10.1 x 6.6 by 8.9 cm rounded fluid collection within the left lower lobe.  There is scattered airspace disease in the right middle lobe, right lower lobe, and left upper lobe.  Bronchiectasis at the right lung base is noted.  There is no abnormal mediastinal adenopathy by measurement criteria.  Sub centimeter short axis diameter nodes are noted.  The patient is cachectic.  Bullous changes at the lung apices are noted.  No destructive bone lesion.  The liver is diffusely heterogeneous in enhancement.  Moderate left pleural effusion is present.  T8 superior end plate depression fracture is of indeterminate age.  IMPRESSION: No evidence of acute pulmonary thromboembolism.  Mass-like consolidation in the left lower lobe.  Left lower lobe air placed abruptly and either due to mucous plugging or endobronchial lesion.  Bronchoscopy may be helpful.  Large loculated fluid collection in the left lower lobe of unknown significance.  Patchy consolidation throughout the other lobes.  Left pleural effusion.  Heterogeneous enhancement in the liver which may simply be due to phase of contrast.  Diffuse infiltration with tumor cannot be excluded.  Mild superior T8 compression fracture of  indeterminate age.   Original Report Authenticated By: Jolaine Click, M.D.     ASSESSMENT / PLAN: Left hilum lung mass in setting of life long tobacco abuse, 50 lb weight losss , hemoptysis with cough x 1 month. Plan: MD to evaluate for FOB Resume diet 4-1 NPO after 12 mn for fob 4/2    [facility] Minor ACNP [facility] Pager [phone] till 3 pm If no answer page [phone] 08/05/2012, 11:55 AM   Discussed in detail all the  indications, usual  risks and alternatives  relative to the benefits with patient who agrees to proceed with bronchoscopy with biopsy.  Sandrea Hughs, MD Pulmonary and Critical Care Medicine [facility] Cell [phone] After 5:30 PM or weekends, call [phone]

## 2012-08-05 NOTE — Progress Notes (Signed)
INITIAL NUTRITION ASSESSMENT  Pt meets criteria for moderate MALNUTRITION in the context of chronic illness as evidenced by <75% estimated energy intake in the past few months in addition to pt with mild/moderate subcutaneous fat loss and muscle wasting in arms and clavicles.  DOCUMENTATION CODES Per approved criteria  -Non-severe (moderate) malnutrition in the context of chronic illness -Underweight   INTERVENTION: - Ensure Complete TID - Multivitamin 1 tablet PO daily - Encouraged increased meal/supplement intake - Will continue to monitor   NUTRITION DIAGNOSIS: Unintended weight loss related to poor appetite as evidenced by pt statement.   Goal: Pt to consume 100% of meals/supplements  Monitor:  Weights, labs, intake  Reason for Assessment: Underweight  57 y.o. male  Admitting Dx: Cough  ASSESSMENT: Pt admitted with coughing for the past 2-3 weeks that has been non-productive. Pt with history of multiple sclerosis. Pt reports 10 pound unintentional weight loss since January 2014 from poor appetite. Pt reports only eating 1-2 meals/day at home and recently added Ensure to his diet last month. Pt denies any problems chewing/swallowing. Pt reports he has been feeling weak. Performed nutrition physical exam.   Nutrition Focused Physical Exam:  Subcutaneous Fat:  Orbital Region: mild/moderate wasting Upper Arm Region: mild/moderate wasting Thoracic and Lumbar Region: mild/moderate wasting  Muscle:  Temple Region: mild/moderate wasting Clavicle Bone Region: mild/moderate wasting Clavicle and Acromion Bone Region: mild/moderate wasting Scapular Bone Region: mild/moderate wasting Dorsal Hand: WNL Patellar Region: WNL Anterior Thigh Region: WNL Posterior Calf Region: WNL  Edema: +4 RLE and LLE edema   Height: Ht Readings from Last 1 Encounters:  08/05/12 6\' 1"  (1.854 m)    Weight: Wt Readings from Last 1 Encounters:  08/05/12 132 lb (59.875 kg)    Ideal Body  Weight: 184 lb   % Ideal Body Weight: 72  Wt Readings from Last 10 Encounters:  08/05/12 132 lb (59.875 kg)    Usual Body Weight: 142 lb per pt report  % Usual Body Weight: 93  BMI:  Body mass index is 17.42 kg/(m^2). Underweight  Estimated Nutritional Needs: Kcal: 2100-2400 Protein: 120-135g Fluid: 2.1-2.4L/day  Skin: +4 RLE and LLE edema  Diet Order: Cardiac  EDUCATION NEEDS: -No education needs identified at this time   Intake/Output Summary (Last 24 hours) at 08/05/12 1613 Last data filed at 08/05/12 1516  Gross per 24 hour  Intake    780 ml  Output    450 ml  Net    330 ml    Last BM: 3/31  Labs:   Recent Labs Lab 08/04/12 2349 08/05/12 0130 08/05/12 0515  NA 131*  --  134*  K 5.7* 3.8 3.7  CL 92*  --  95*  CO2 28  --  28  BUN 14  --  11  CREATININE 0.54  --  0.63  CALCIUM 8.8  --  8.6  GLUCOSE 129*  --  109*    CBG (last 3)  No results found for this basename: GLUCAP,  in the last 72 hours  Scheduled Meds: . [START ON 08/06/2012] azithromycin  250 mg Oral Daily  . benzonatate  200 mg Oral TID  . dextromethorphan-guaiFENesin  1 tablet Oral BID  . [START ON 08/06/2012] enoxaparin (LOVENOX) injection  40 mg Subcutaneous Q24H  . iron polysaccharides  150 mg Oral BID  . pantoprazole  40 mg Oral Daily  . piperacillin-tazobactam (ZOSYN)  IV  3.375 g Intravenous Q8H  . vancomycin  1,000 mg Intravenous Q12H    Continuous  Infusions:   Past Medical History  Diagnosis Date  . Multiple sclerosis     Past Surgical History  Procedure Laterality Date  . Lipoma removal       Levon Hedger MS, RD, LDN [phone] Pager [phone] After Hours Pager

## 2012-08-06 ENCOUNTER — Encounter (HOSPITAL_COMMUNITY): Payer: Self-pay | Admitting: Critical Care Medicine

## 2012-08-06 ENCOUNTER — Encounter (HOSPITAL_COMMUNITY)
Admission: EM | Disposition: A | Discharge status: Skilled Nursing Facility | Payer: Self-pay | Source: Home / Self Care | Attending: Family Medicine

## 2012-08-06 ENCOUNTER — Inpatient Hospital Stay (HOSPITAL_COMMUNITY): Payer: BC Managed Care – PPO

## 2012-08-06 DIAGNOSIS — J984 Other disorders of lung: Secondary | ICD-10-CM | POA: Diagnosis present

## 2012-08-06 HISTORY — PX: VIDEO BRONCHOSCOPY: SHX5072

## 2012-08-06 LAB — BASIC METABOLIC PANEL
CO2: 30 mEq/L (ref 19–32)
Glucose, Bld: 94 mg/dL (ref 70–99)
Potassium: 3.8 mEq/L (ref 3.5–5.1)
Sodium: 134 mEq/L — ABNORMAL LOW (ref 135–145)

## 2012-08-06 LAB — URINE CULTURE: Culture: NO GROWTH

## 2012-08-06 LAB — CBC
Hemoglobin: 8 g/dL — ABNORMAL LOW (ref 13.0–17.0)
RBC: 3.35 MIL/uL — ABNORMAL LOW (ref 4.22–5.81)
WBC: 12.5 10*3/uL — ABNORMAL HIGH (ref 4.0–10.5)

## 2012-08-06 LAB — MRSA PCR SCREENING: MRSA by PCR: POSITIVE — AB

## 2012-08-06 SURGERY — BRONCHOSCOPY, WITH FLUOROSCOPY
Anesthesia: Moderate Sedation

## 2012-08-06 MED ORDER — LIDOCAINE HCL 2 % EX GEL: Freq: Once | CUTANEOUS | Status: DC

## 2012-08-06 MED ORDER — PHENYLEPHRINE HCL 0.25 % NA SOLN
NASAL | Status: DC | PRN
  Administered 2012-08-06: 2 via NASAL

## 2012-08-06 MED ORDER — MUPIROCIN 2 % EX OINT
1.0000 | TOPICAL_OINTMENT | Freq: Two times a day (BID) | CUTANEOUS | Status: AC
Start: 2012-08-06 — End: 2012-08-11
  Administered 2012-08-06 – 2012-08-11 (×10): 1 via NASAL
  Filled 2012-08-06 (×2): qty 22

## 2012-08-06 MED ORDER — LIDOCAINE HCL 1 % IJ SOLN
INTRAMUSCULAR | Status: DC | PRN
  Administered 2012-08-06: 6 mL via RESPIRATORY_TRACT

## 2012-08-06 MED ORDER — LIDOCAINE HCL 2 % EX GEL
CUTANEOUS | Status: DC | PRN
  Administered 2012-08-06: 1

## 2012-08-06 MED ORDER — SODIUM CHLORIDE 0.9 % IV SOLN: Freq: Once | INTRAVENOUS | Status: DC

## 2012-08-06 MED ORDER — ADULT MULTIVITAMIN W/MINERALS CH
1.0000 | ORAL_TABLET | Freq: Every day | ORAL | Status: DC
  Administered 2012-08-06 – 2012-08-14 (×9): 1 via ORAL
  Filled 2012-08-06 (×9): qty 1

## 2012-08-06 MED ORDER — BUTAMBEN-TETRACAINE-BENZOCAINE 2-2-14 % EX AERO: 1.0000 | INHALATION_SPRAY | Freq: Once | CUTANEOUS | Status: DC

## 2012-08-06 MED ORDER — METHYLPREDNISOLONE SODIUM SUCC 125 MG IJ SOLR
80.0000 mg | Freq: Four times a day (QID) | INTRAMUSCULAR | Status: DC
  Administered 2012-08-06 – 2012-08-07 (×3): 80 mg via INTRAVENOUS
  Filled 2012-08-06 (×2): qty 1.28
  Filled 2012-08-06: qty 2
  Filled 2012-08-06: qty 1.28
  Filled 2012-08-06: qty 2
  Filled 2012-08-06 (×5): qty 1.28

## 2012-08-06 MED ORDER — PANTOPRAZOLE SODIUM 40 MG PO TBEC
40.0000 mg | DELAYED_RELEASE_TABLET | Freq: Every day | ORAL | Status: DC
  Administered 2012-08-06 – 2012-08-14 (×9): 40 mg via ORAL
  Filled 2012-08-06 (×8): qty 1

## 2012-08-06 MED ORDER — AZITHROMYCIN 250 MG PO TABS
250.0000 mg | ORAL_TABLET | Freq: Every day | ORAL | Status: DC
  Administered 2012-08-06: 250 mg via ORAL
  Filled 2012-08-06 (×2): qty 1

## 2012-08-06 MED ORDER — CHLORHEXIDINE GLUCONATE CLOTH 2 % EX PADS
6.0000 | MEDICATED_PAD | Freq: Every day | CUTANEOUS | Status: AC
  Administered 2012-08-07 – 2012-08-11 (×4): 6 via TOPICAL

## 2012-08-06 MED ORDER — MIDAZOLAM HCL 10 MG/2ML IJ SOLN
INTRAMUSCULAR | Status: DC | PRN
  Administered 2012-08-06: 2 mg via INTRAVENOUS

## 2012-08-06 MED ORDER — FENTANYL CITRATE 0.05 MG/ML IJ SOLN
INTRAMUSCULAR | Status: DC | PRN
  Administered 2012-08-06: 20 ug via INTRAVENOUS
  Administered 2012-08-06: 30 ug via INTRAVENOUS

## 2012-08-06 MED ORDER — PHENYLEPHRINE HCL 0.25 % NA SOLN: 1.0000 | Freq: Four times a day (QID) | NASAL | Status: DC | PRN

## 2012-08-06 MED ORDER — ACETAMINOPHEN 500 MG PO TABS
500.0000 mg | ORAL_TABLET | Freq: Four times a day (QID) | ORAL | Status: DC | PRN
  Administered 2012-08-06 – 2012-08-11 (×5): 500 mg via ORAL
  Filled 2012-08-06 (×5): qty 1

## 2012-08-06 MED ORDER — ALBUTEROL SULFATE (5 MG/ML) 0.5% IN NEBU
2.5000 mg | INHALATION_SOLUTION | RESPIRATORY_TRACT | Status: DC
  Administered 2012-08-06 – 2012-08-07 (×6): 2.5 mg via RESPIRATORY_TRACT
  Filled 2012-08-06 (×5): qty 0.5

## 2012-08-06 MED ORDER — IPRATROPIUM BROMIDE 0.02 % IN SOLN
0.5000 mg | Freq: Four times a day (QID) | RESPIRATORY_TRACT | Status: DC
  Administered 2012-08-06 – 2012-08-08 (×8): 0.5 mg via RESPIRATORY_TRACT
  Filled 2012-08-06 (×9): qty 2.5

## 2012-08-06 NOTE — Progress Notes (Addendum)
CRITICAL VALUE ALERT  Critical value received:  MRSA PCR positive   Date of notification 08/06/12  Time of notification:  1624  Critical value read back:yes  Nurse who received alert:  K. Beverely Pace, RN  MD notified (1st page): no page neccessary  Time of first page:  1634--Followed MRSA + protocol  MD notified (2nd page):  Time of second page:  Responding MD:    Time MD responded:

## 2012-08-06 NOTE — H&P (View-Only) (Signed)
PULMONARY  / CRITICAL CARE MEDICINE  Name: Devon Glenn MRN: [id number] DOB: 06/24/1955    ADMISSION DATE:  08/04/2012 CONSULTATION DATE:  4-1  REFERRING MD :  [facility] PRIMARY SERVICE:    CHIEF COMPLAINT:  Cough, weight loss, hempotysis   SIGNIFICANT EVENTS / STUDIES:  4-1 see ct of chest report  LINES / TUBES:   CULTURES: 4-1 bc x 2>> 4-1 uc>> 4-1 sputum>>  ANTIBIOTICS: 4-1 zoysn>> 4-1 vanco>>  HISTORY OF PRESENT ILLNESS:   57 yo AAM , life long 1 ppd smoker who quit 1 month ago. He reports 50 lb weight  Loss, cough x 1 months with intermittent scant blood.  He has e=recently been treated for Pna. He denies chest pain, orthopnea. CT of chest reveals a left sided mass. Pulmonary has been asked to evaluate for possible FOB and bx of lung mass.  PAST MEDICAL HISTORY :  Past Medical History  Diagnosis Date  . Multiple sclerosis    Past Surgical History  Procedure Laterality Date  . Lipoma removal     Prior to Admission medications   Medication Sig Start Date End Date Taking? Authorizing Provider  amLODipine (NORVASC) 5 MG tablet Take 5 mg by mouth daily.   Yes Historical Provider, MD  furosemide (LASIX) 20 MG tablet Take 20 mg by mouth 2 (two) times daily.   Yes Historical Provider, MD  iron polysaccharides (NIFEREX) 150 MG capsule Take 150 mg by mouth 2 (two) times daily.   Yes Historical Provider, MD  pantoprazole (PROTONIX) 40 MG tablet Take 40 mg by mouth daily.   Yes Historical Provider, MD  potassium chloride (K-DUR) 10 MEQ tablet Take 10 mEq by mouth 2 (two) times daily.   Yes Historical Provider, MD   Allergies  Allergen Reactions  . Penicillins     unknown    FAMILY HISTORY:  Family History  Problem Relation Age of Onset  . Breast cancer Mother   . Cancer Father   . Breast cancer Sister    SOCIAL HISTORY:  reports that he quit smoking about 1 months ago. He has never used smokeless tobacco. He reports that he does not drink alcohol or use illicit  drugs.  REVIEW OF SYSTEMS:   10 point review of system taken, please see HPI for positives and negatives. .  SUBJECTIVE:  Nad at rest   VITAL SIGNS: Temp:  [98.1 F (36.7 C)-98.9 F (37.2 C)] 98.2 F (36.8 C) (04/01 0542) Pulse Rate:  [91-121] 92 (04/01 0542) Resp:  [18-25] 18 (04/01 0542) BP: (95-128)/(62-78) 99/65 mmHg (04/01 0542) SpO2:  [89 %-100 %] 100 % (04/01 0542) Weight:  [59.875 kg (132 lb)] 59.875 kg (132 lb) (04/01 0238)  PHYSICAL EXAMINATION: General:  Thin and wasted. + tremor, speech has odd cadence. Neuro:  Tremor, slow speech HEENT:  No LAN Neck:  No jvd Cardiovascular:  Hsr Lungs:  Decreased in bases left > right Abdomen:  Soft +bs Musculoskeletal:  intact Skin:  +lower ext edema   Recent Labs Lab 08/04/12 2349 08/05/12 0130 08/05/12 0515  NA 131*  --  134*  K 5.7* 3.8 3.7  CL 92*  --  95*  CO2 28  --  28  BUN 14  --  11  CREATININE 0.54  --  0.63  GLUCOSE 129*  --  109*    Recent Labs Lab 08/04/12 2349 08/05/12 0515  HGB 8.7* 8.7*  HCT 29.1* 29.2*  WBC 12.9* 12.3*  PLT 587* 646*   Dg Chest 2   View (if Patient Has Fever And/or Copd)  08/04/2012  *RADIOLOGY REPORT*  Clinical Data: Cough, congestion, leg swelling.  CHEST - 2 VIEW  Comparison: None.  Findings: There is a large opacity in the left lower lung probably representing consolidation due to pneumonia.  Underlying mass lesion is not excluded and follow up after resolution of acute symptoms is recommended.  The heart size appears normal but is obscured by the left parenchymal process.  The right lung appears clear and expanded.  No blunting of costophrenic angles.  No pneumothorax. Scarring and emphysematous changes in the lung apices.  IMPRESSION: Large opacity in the left lower lung likely to represent consolidation due to pneumonia.  Follow up after resolution of acute symptoms is recommended to exclude underlying mass lesion.   Original Report Authenticated By: William Stevens, M.D.     Ct Angio Chest W/cm &/or Wo Cm  08/05/2012  *RADIOLOGY REPORT*  Clinical Data: Leg swelling  CT ANGIOGRAPHY CHEST  Technique:  Multidetector CT imaging of the chest using the standard protocol during bolus administration of intravenous contrast. Multiplanar reconstructed images including MIPs were obtained and reviewed to evaluate the vascular anatomy.  Contrast:  100 ml Omnipaque 350  Comparison: None.  Findings: There are no filling defects in the pulmonary arterial tree to suggest acute pulmonary thromboembolism.  There is mass-like consolidation extending from the left inferior hilum towards the left lung base.  There is abrupt occlusion of the left lower lobe airways either due to endobronchial mass or mucous plugging.  There is a large 10.1 x 6.6 by 8.9 cm rounded fluid collection within the left lower lobe.  There is scattered airspace disease in the right middle lobe, right lower lobe, and left upper lobe.  Bronchiectasis at the right lung base is noted.  There is no abnormal mediastinal adenopathy by measurement criteria.  Sub centimeter short axis diameter nodes are noted.  The patient is cachectic.  Bullous changes at the lung apices are noted.  No destructive bone lesion.  The liver is diffusely heterogeneous in enhancement.  Moderate left pleural effusion is present.  T8 superior end plate depression fracture is of indeterminate age.  IMPRESSION: No evidence of acute pulmonary thromboembolism.  Mass-like consolidation in the left lower lobe.  Left lower lobe air placed abruptly and either due to mucous plugging or endobronchial lesion.  Bronchoscopy may be helpful.  Large loculated fluid collection in the left lower lobe of unknown significance.  Patchy consolidation throughout the other lobes.  Left pleural effusion.  Heterogeneous enhancement in the liver which may simply be due to phase of contrast.  Diffuse infiltration with tumor cannot be excluded.  Mild superior T8 compression fracture of  indeterminate age.   Original Report Authenticated By: Arthur Hoss, M.D.     ASSESSMENT / PLAN: Left hilum lung mass in setting of life long tobacco abuse, 50 lb weight losss , hemoptysis with cough x 1 month. Plan: MD to evaluate for FOB Resume diet 4-1 NPO after 12 mn for fob 4/2    Steve Minor ACNP [facility] Pager [phone] till 3 pm If no answer page [phone] 08/05/2012, 11:55 AM   Discussed in detail all the  indications, usual  risks and alternatives  relative to the benefits with patient who agrees to proceed with bronchoscopy with biopsy.  Etoile Looman, MD Pulmonary and Critical Care Medicine [facility] Cell [phone] After 5:30 PM or weekends, call [phone]    

## 2012-08-06 NOTE — Consult Note (Signed)
PULMONARY  / CRITICAL CARE MEDICINE  Name: Devon Glenn MRN: [id number] DOB: Jul 30, 1955    ADMISSION DATE:  08/04/2012 CONSULTATION DATE:  4-1  REFERRING MD :  [facility] SERVICE:    CHIEF COMPLAINT:  Cough, weight loss, hempotysis   SIGNIFICANT EVENTS / STUDIES:  4-1 see ct of chest report 4-2 FOB terminated early due to copious secretions and resp distress.     LINES / TUBES:   CULTURES: 4-1 bc x 2>> 4-1 uc>> 4-1 sputum>> 4-2 bronchial washings>>  ANTIBIOTICS: 4-1 zoysn>> 4-1 vanco>>  HISTORY OF PRESENT ILLNESS:   57 yo AAM , life long 1 ppd smoker who quit 1 month ago. He reports 50 lb weight  Loss, cough x 1 months with intermittent scant blood.  He has e=recently been treated for Pna. He denies chest pain, orthopnea. CT of chest reveals a left sided mass. Pulmonary has been asked to evaluate for possible FOB and bx of lung mass.   .  SUBJECTIVE:  Sedated post bronch  VITAL SIGNS: Temp:  [98.2 F (36.8 C)-99.5 F (37.5 C)] 99 F (37.2 C) (04/02 0622) Pulse Rate:  [88-112] 111 (04/02 1125) Resp:  [13-44] 35 (04/02 1125) BP: (94-144)/(58-119) 127/75 mmHg (04/02 1125) SpO2:  [77 %-100 %] 93 % (04/02 1125)  PHYSICAL EXAMINATION: General:  Thin and wasted. + tremor,sedated Neuro:  Tremor, sedated HEENT:  No LAN Neck:  No jvd Cardiovascular:  Hsr Lungs:  Decreased in bases left > right Abdomen:  Soft +bs Musculoskeletal:  intact Skin:  +lower ext edema   Recent Labs Lab 08/04/12 2349 08/05/12 0130 08/05/12 0515 08/06/12 0751  NA 131*  --  134* 134*  K 5.7* 3.8 3.7 3.8  CL 92*  --  95* 96  CO2 28  --  28 30  BUN 14  --  11 9  CREATININE 0.54  --  0.63 0.56  GLUCOSE 129*  --  109* 94    Recent Labs Lab 08/04/12 2349 08/05/12 0515 08/06/12 0751  HGB 8.7* 8.7* 8.0*  HCT 29.1* 29.2* 26.3*  WBC 12.9* 12.3* 12.5*  PLT 587* 646* 570*   Dg Chest 2 View (if Patient Has Fever And/or Copd)  08/04/2012  *RADIOLOGY REPORT*  Clinical Data:  Cough, congestion, leg swelling.  CHEST - 2 VIEW  Comparison: None.  Findings: There is a large opacity in the left lower lung probably representing consolidation due to pneumonia.  Underlying mass lesion is not excluded and follow up after resolution of acute symptoms is recommended.  The heart size appears normal but is obscured by the left parenchymal process.  The right lung appears clear and expanded.  No blunting of costophrenic angles.  No pneumothorax. Scarring and emphysematous changes in the lung apices.  IMPRESSION: Large opacity in the left lower lung likely to represent consolidation due to pneumonia.  Follow up after resolution of acute symptoms is recommended to exclude underlying mass lesion.   Original Report Authenticated By: Burman Nieves, M.D.    Ct Angio Chest W/cm &/or Wo Cm  08/05/2012  *RADIOLOGY REPORT*  Clinical Data: Leg swelling  CT ANGIOGRAPHY CHEST  Technique:  Multidetector CT imaging of the chest using the standard protocol during bolus administration of intravenous contrast. Multiplanar reconstructed images including MIPs were obtained and reviewed to evaluate the vascular anatomy.  Contrast:  100 ml Omnipaque 350  Comparison: None.  Findings: There are no filling defects in the pulmonary arterial tree to suggest acute pulmonary thromboembolism.  There is mass-like consolidation  extending from the left inferior hilum towards the left lung base.  There is abrupt occlusion of the left lower lobe airways either due to endobronchial mass or mucous plugging.  There is a large 10.1 x 6.6 by 8.9 cm rounded fluid collection within the left lower lobe.  There is scattered airspace disease in the right middle lobe, right lower lobe, and left upper lobe.  Bronchiectasis at the right lung base is noted.  There is no abnormal mediastinal adenopathy by measurement criteria.  Sub centimeter short axis diameter nodes are noted.  The patient is cachectic.  Bullous changes at the lung apices are  noted.  No destructive bone lesion.  The liver is diffusely heterogeneous in enhancement.  Moderate left pleural effusion is present.  T8 superior end plate depression fracture is of indeterminate age.  IMPRESSION: No evidence of acute pulmonary thromboembolism.  Mass-like consolidation in the left lower lobe.  Left lower lobe air placed abruptly and either due to mucous plugging or endobronchial lesion.  Bronchoscopy may be helpful.  Large loculated fluid collection in the left lower lobe of unknown significance.  Patchy consolidation throughout the other lobes.  Left pleural effusion.  Heterogeneous enhancement in the liver which may simply be due to phase of contrast.  Diffuse infiltration with tumor cannot be excluded.  Mild superior T8 compression fracture of indeterminate age.   Original Report Authenticated By: Jolaine Click, M.D.     ASSESSMENT / PLAN: Left hilum lung mass in setting of life long tobacco abuse, 50 lb weight losss , hemoptysis with cough x 1 month. Plan:  FOB performed 4-2. Please see operative note. Resp distress during procedure Presumed Pna  Plan:  Transfer to ICU O 2 therapy BD therapy Cardiac monitor No NIMVS due to copious secretions May need intubation. If intubated then consider TBBX with ET in place. NPO for now    Caryl Bis  [phone]  Cell  [phone]  If no response or cell goes to voicemail, call beeper [phone]  08/06/2012, 11:46 AM

## 2012-08-06 NOTE — Progress Notes (Signed)
PCCM  Trying to get on FOB schedule. Hope to do so such that pt would get FOB at 11AM today.  Dorcas Carrow Beeper  [phone]  Cell  [phone]  If no response or cell goes to voicemail, call beeper [phone]  08/06/2012

## 2012-08-06 NOTE — Op Note (Signed)
Bronchoscopy Procedure Note  Date of Operation: 08/06/2012  Pre-op Diagnosis: LLL lung mass   Post-op Diagnosis: same   Surgeon: [name]  Anesthesia: Monitored Local Anesthesia with Sedation, versed 2mg  IV, fentanyl IV  Operation: Flexible fiberoptic bronchoscopy, diagnostic   Findings: Exophytic lesion completely occluding LLL orifice  Specimen: Bronch washings LLL. Unable to do biopsies d/t severe desaturation   Estimated Blood Loss: less than 50   Complications: Severe desaturation into 70s on NRM oxygen. Unable to complete FOB d/t respiratory failure. Would have to place on vent/intubate to complete exam.   Indications and History: The patient is a 57 y.o. male with LLL mass.  The risks, benefits, complications, treatment options and expected outcomes were discussed with the patient.  The possibilities of reaction to medication, pulmonary aspiration, perforation of a viscus, bleeding, failure to diagnose a condition and creating a complication requiring transfusion or operation were discussed with the patient who freely signed the consent.    Description of Procedure: The patient was re-examined in the bronchoscopy suite and the site of surgery properly noted/marked.  The patient was identified as Moreen Fowler and the procedure verified as Flexible Fiberoptic Bronchoscopy.  A Time Out was held and the above information confirmed.   After the induction of topical nasopharyngeal anesthesia, the patient was positioned  and the bronchoscope was passed through the Left nares. The vocal cords were visualized and  1% buffered lidocaine 5 ml was topically placed onto the cords. The cords were normal. The scope was then passed into the trachea.  1% buffered lidocaine 5 ml was used topically on the carina.  Careful inspection of the tracheal lumen was accomplished. The scope was sequentially passed into the left main and then left upper and lower bronchi and segmental bronchi.    There was exophytic lesion totally occluding LLL orifice. Large amount of tracheal and LLL airway secretions.  Lesion bleed excessively with suctioning alone.  The scope was then withdrawn and advanced into the right main bronchus and then into the RUL, RML, and RLL bronchi and segmental bronchi.  Only specimen was bronch wash for cytology from LLL.  Endobronchial findings: Exophytic friable lesion totally occluding LLL orifice. Trachea: Edematous mucosa Carina: Edematous mucosa Right main bronchus: Edematous mucosa Right upper lobe bronchus: Edematous mucosa Right middle lobe bronchus: Edematous mucosa Right lower lobe bronchus: Edematous mucosa Left main bronchus: Edematous mucosa Left upper lobe bronchus: Edematous mucosa Left lower lobe bronchus: total occlusion of L LL orifice with tumor and secretions.  The Patient was maintained in bronch room and will need SDU bed post bronch.  Attestation: I performed the procedure.  Dorcas Carrow  Beeper  [phone]  Cell  [phone]  If no response or cell goes to voicemail, call beeper [phone]  08/06/2012

## 2012-08-06 NOTE — Interval H&P Note (Signed)
Pt seen and examined and I agree with the above H and P and there are no interval changes and the pt is satisfactory for planned FOB. [name] MD Beeper  [phone]  Cell  [phone]  If no response or cell goes to voicemail, call beeper [phone]

## 2012-08-06 NOTE — Progress Notes (Signed)
[facility] PROGRESS NOTE  Devon Glenn VOZ:[id number] DOB: 1956-03-28 DOA: 08/04/2012 PCP: No primary provider on file.  Devon Glenn is a 57 yo M with MS who presents with cough and a 50-lb weight loss.  CT chest demonstrated a large left lung mass and PNA.  He was seen by PCCM and will undergo bronchoscopic biopsy on 4/2.    Assessment/Plan  Acute hypoxic respiratory failure with SOB:  Likely a combination of enlarging mass and/or pneumonia.  Was started on broad spectrum antibiotics and nasal canula overnight and feeling somewhat better this morning.  CT neg for PE.  Health care associated pneumonia (hospital-acquired pneumonia) vs. Post-obstructive pneumonia -  Continue vancomycin and zosyn -  Continue atypical coverage with azithromycin -  await urine legionella and s. pneumo ag negative -  Sputum culture is pending -  NO-growth on blood cultures 4.1.14 so far  Lung mass, large and without significant mediastinal/hilar LAD.  Patient aware that he may have cancer.   -  Pulmonary consulted-scheduled for BAL today -await cultures and preliminary washings/biopsy results  -Unable to get much  Info given a mass that was noted and patient de-sat to 80's with attempt at bronchoscopy.  Leg edema, severe, likely due to hypoalbuminemia 2/2 malignancy-associated wasting, but in setting of malignancy, agree with ruling out DVT -  Nutrition consult when starting diet again -  Dietary supplements  -  ECHO 4.1 =Ef60-65% with ? Wall motion anomaly -  UA for proteinuria -  Lower extremity duplex 4.1 showed ruptured baker's cyst -  Elevate lower extremities -  TED hose when able -  Add SCDs in the mean time  Hyponatremia - May be mildly intravascularly deplete or have some heart failure, however, I suspect he may have some SIADH from his pulmonary issues -  ECHO wnl -  LFTs wnl -  TSH pending -  Gentle hydration -  Serum osms 278 [low], Uosm 802-probably 2/2 to potomania with low  solute intake  Leukocytosis:  May be due to malignancy or pneumonia.  Trending down -  Trend  Microcytic anemia -  F/u anemia panel- shows Predmonant iron deficiency -would transfuse blood if less than 7.0-rpt in am -  Hemoccult stool   Thrombocytosis:  May be due to malignancy or pneumonia.  Trending up -  Trend  Multiple sclerosis - patient unable to provide much history regarding this. -  Unclear history  Diet:  NPO for now Access:  PIV IVF:  OFF Proph:  lovenox to start post-procedure, SCDs now  Code Status: full code Family Communication: spoke with patient alone Disposition Plan: pending medically stable.  PT consult prior to discharge.     Consultants:  pulmonary  Procedures:  CTa chest  Antibiotics:  Vancomycin 4/1 >>  Zosyn 4/1 >>  Azithro 4/1 >>   HPI/Subjective:  Doing fair.  Still SOb.  Sputum seems to have cleared a little  Objective: Filed Vitals:   08/06/12 1005 08/06/12 1010 08/06/12 1015 08/06/12 1020  BP: 97/60 95/61 103/62 99/62  Pulse: 91 91 90 88  Temp:      TempSrc:      Resp: 21 24 20  33  Height:      Weight:      SpO2: 99% 100% 100% 100%    Intake/Output Summary (Last 24 hours) at 08/06/12 1031 Last data filed at 08/06/12 0627  Gross per 24 hour  Intake    790 ml  Output    550 ml  Net  240 ml   Filed Weights   08/05/12 0238  Weight: 59.875 kg (132 lb)    Exam:   General:  Cachectic Devon Glenn, No acute distress  HEENT:  NCAT, MMM  Cardiovascular:  RRR, nl S1, S2 no mrg, 2+ pulses, warm extremities  Respiratory:  Diminished BS at the left base to the left mid-back.  CTA on the right.  No wheezes, rales, or rhonchi.  No increased WOB while on nasal canula.   Abdomen:   NABS, soft, NT/ND  MSK:   Normal tone and bulk, 3+ bilateral ankle edema.    Neuro:  Grossly intact  Data Reviewed: Basic Metabolic Panel:  Recent Labs Lab 08/04/12 2349 08/05/12 0130 08/05/12 0515 08/06/12 0751  NA 131*  --  134* 134*   K 5.7* 3.8 3.7 3.8  CL 92*  --  95* 96  CO2 28  --  28 30  GLUCOSE 129*  --  109* 94  BUN 14  --  11 9  CREATININE 0.54  --  0.63 0.56  CALCIUM 8.8  --  8.6 8.3*   Liver Function Tests:  Recent Labs Lab 08/05/12 0515  AST 8  ALT <5  ALKPHOS 91  BILITOT 0.3  PROT 6.8  ALBUMIN 1.8*   No results found for this basename: LIPASE, AMYLASE,  in the last 168 hours No results found for this basename: AMMONIA,  in the last 168 hours CBC:  Recent Labs Lab 08/04/12 2349 08/05/12 0515 08/06/12 0751  WBC 12.9* 12.3* 12.5*  NEUTROABS  --  9.4*  --   HGB 8.7* 8.7* 8.0*  HCT 29.1* 29.2* 26.3*  MCV 77.4* 78.5 78.5  PLT 587* 646* 570*   Cardiac Enzymes:  Recent Labs Lab 08/05/12 0130  TROPONINI <0.30   BNP (last 3 results)  Recent Labs  08/04/12 2349  PROBNP 377.7*   CBG: No results found for this basename: GLUCAP,  in the last 168 hours  Recent Results (from the past 240 hour(s))  CULTURE, BLOOD (ROUTINE X 2)     Status: None   Collection Time    08/05/12  1:20 AM      Result Value Range Status   Specimen Description BLOOD RIGHT WRIST   Final   Special Requests BOTTLES DRAWN AEROBIC AND ANAEROBIC 5CC   Final   Culture  Setup Time 08/05/2012 08:41   Final   Culture     Final   Value:        BLOOD CULTURE RECEIVED NO GROWTH TO DATE CULTURE WILL BE HELD FOR 5 DAYS BEFORE ISSUING A FINAL NEGATIVE REPORT   Report Status PENDING   Incomplete  CULTURE, BLOOD (ROUTINE X 2)     Status: None   Collection Time    08/05/12  1:30 AM      Result Value Range Status   Specimen Description BLOOD LEFT HAND   Final   Special Requests BOTTLES DRAWN AEROBIC AND ANAEROBIC 5CC   Final   Culture  Setup Time 08/05/2012 08:41   Final   Culture     Final   Value:        BLOOD CULTURE RECEIVED NO GROWTH TO DATE CULTURE WILL BE HELD FOR 5 DAYS BEFORE ISSUING A FINAL NEGATIVE REPORT   Report Status PENDING   Incomplete  CULTURE, EXPECTORATED SPUTUM-ASSESSMENT     Status: None    Collection Time    08/05/12 10:56 PM      Result Value Range Status   Specimen Description  SPUTUM   Final   Special Requests Normal   Final   Sputum evaluation     Final   Value: THIS SPECIMEN IS ACCEPTABLE. RESPIRATORY CULTURE REPORT TO FOLLOW.   Report Status 08/05/2012 FINAL   Final     Studies: Dg Chest 2 View (if Patient Has Fever And/or Copd)  08/04/2012  *RADIOLOGY REPORT*  Clinical Data: Cough, congestion, leg swelling.  CHEST - 2 VIEW  Comparison: None.  Findings: There is a large opacity in the left lower lung probably representing consolidation due to pneumonia.  Underlying mass lesion is not excluded and follow up after resolution of acute symptoms is recommended.  The heart size appears normal but is obscured by the left parenchymal process.  The right lung appears clear and expanded.  No blunting of costophrenic angles.  No pneumothorax. Scarring and emphysematous changes in the lung apices.  IMPRESSION: Large opacity in the left lower lung likely to represent consolidation due to pneumonia.  Follow up after resolution of acute symptoms is recommended to exclude underlying mass lesion.   Original Report Authenticated By: Burman Nieves, M.D.    Ct Angio Chest W/cm &/or Wo Cm  08/05/2012  *RADIOLOGY REPORT*  Clinical Data: Leg swelling  CT ANGIOGRAPHY CHEST  Technique:  Multidetector CT imaging of the chest using the standard protocol during bolus administration of intravenous contrast. Multiplanar reconstructed images including MIPs were obtained and reviewed to evaluate the vascular anatomy.  Contrast:  100 ml Omnipaque 350  Comparison: None.  Findings: There are no filling defects in the pulmonary arterial tree to suggest acute pulmonary thromboembolism.  There is mass-like consolidation extending from the left inferior hilum towards the left lung base.  There is abrupt occlusion of the left lower lobe airways either due to endobronchial mass or mucous plugging.  There is a large 10.1  x 6.6 by 8.9 cm rounded fluid collection within the left lower lobe.  There is scattered airspace disease in the right middle lobe, right lower lobe, and left upper lobe.  Bronchiectasis at the right lung base is noted.  There is no abnormal mediastinal adenopathy by measurement criteria.  Sub centimeter short axis diameter nodes are noted.  The patient is cachectic.  Bullous changes at the lung apices are noted.  No destructive bone lesion.  The liver is diffusely heterogeneous in enhancement.  Moderate left pleural effusion is present.  T8 superior end plate depression fracture is of indeterminate age.  IMPRESSION: No evidence of acute pulmonary thromboembolism.  Mass-like consolidation in the left lower lobe.  Left lower lobe air placed abruptly and either due to mucous plugging or endobronchial lesion.  Bronchoscopy may be helpful.  Large loculated fluid collection in the left lower lobe of unknown significance.  Patchy consolidation throughout the other lobes.  Left pleural effusion.  Heterogeneous enhancement in the liver which may simply be due to phase of contrast.  Diffuse infiltration with tumor cannot be excluded.  Mild superior T8 compression fracture of indeterminate age.   Original Report Authenticated By: Jolaine Click, M.D.     Scheduled Meds: . azithromycin  250 mg Oral Daily  . benzonatate  200 mg Oral TID  . dextromethorphan-guaiFENesin  1 tablet Oral BID  . enoxaparin (LOVENOX) injection  40 mg Subcutaneous Q24H  . feeding supplement  237 mL Oral TID WC  . iron polysaccharides  150 mg Oral BID  . multivitamin with minerals  1 tablet Oral Daily  . pantoprazole  40 mg Oral Daily  .  piperacillin-tazobactam (ZOSYN)  IV  3.375 g Intravenous Q8H  . vancomycin  1,000 mg Intravenous Q12H   Continuous Infusions:   Active Problems:   Leg edema   Hyponatremia   Anemia   HAP (hospital-acquired pneumonia)   Multiple sclerosis   Hemoptysis    Time spent: 30 min    Devon Glenn  [facility]  [facility] Pager [phone].  If 7PM-7AM, please contact night-coverage at [email or web address], password [facility] 08/06/2012, 10:31 AM  LOS: 2 days

## 2012-08-06 NOTE — Progress Notes (Signed)
Patient to be transferred to ICU room 1232.  Report given to [facility] in ICU.  Belongings from 1504 to be sent to room 1232.

## 2012-08-06 NOTE — Progress Notes (Signed)
Bronch w/ video intervention performed.  Bronchial washing intervention performed.

## 2012-08-06 NOTE — Progress Notes (Signed)
I agree Devon Glenn

## 2012-08-07 ENCOUNTER — Inpatient Hospital Stay (HOSPITAL_COMMUNITY): Payer: BC Managed Care – PPO

## 2012-08-07 ENCOUNTER — Encounter (HOSPITAL_COMMUNITY): Payer: Self-pay | Admitting: Critical Care Medicine

## 2012-08-07 LAB — APTT: aPTT: 52 seconds — ABNORMAL HIGH (ref 24–37)

## 2012-08-07 LAB — PROTIME-INR
INR: 1.21 (ref 0.00–1.49)
Prothrombin Time: 15.1 seconds (ref 11.6–15.2)

## 2012-08-07 LAB — BASIC METABOLIC PANEL
BUN: 9 mg/dL (ref 6–23)
CO2: 30 mEq/L (ref 19–32)
Chloride: 94 mEq/L — ABNORMAL LOW (ref 96–112)
Glucose, Bld: 155 mg/dL — ABNORMAL HIGH (ref 70–99)
Potassium: 3.9 mEq/L (ref 3.5–5.1)

## 2012-08-07 LAB — CBC
HCT: 27.4 % — ABNORMAL LOW (ref 39.0–52.0)
Hemoglobin: 8.3 g/dL — ABNORMAL LOW (ref 13.0–17.0)
WBC: 12.4 10*3/uL — ABNORMAL HIGH (ref 4.0–10.5)

## 2012-08-07 LAB — LEGIONELLA ANTIGEN, URINE

## 2012-08-07 MED ORDER — SENNOSIDES-DOCUSATE SODIUM 8.6-50 MG PO TABS
1.0000 | ORAL_TABLET | Freq: Two times a day (BID) | ORAL | Status: DC
  Administered 2012-08-07 – 2012-08-14 (×13): 1 via ORAL
  Filled 2012-08-07 (×16): qty 1

## 2012-08-07 MED ORDER — ALBUTEROL SULFATE (5 MG/ML) 0.5% IN NEBU
2.5000 mg | INHALATION_SOLUTION | Freq: Four times a day (QID) | RESPIRATORY_TRACT | Status: DC
  Administered 2012-08-07 – 2012-08-08 (×4): 2.5 mg via RESPIRATORY_TRACT
  Filled 2012-08-07 (×5): qty 0.5

## 2012-08-07 MED ORDER — AZITHROMYCIN 250 MG PO TABS
250.0000 mg | ORAL_TABLET | Freq: Every day | ORAL | Status: DC
  Administered 2012-08-07 – 2012-08-08 (×2): 250 mg via ORAL
  Filled 2012-08-07 (×2): qty 1

## 2012-08-07 MED ORDER — METHYLPREDNISOLONE SODIUM SUCC 40 MG IJ SOLR
40.0000 mg | Freq: Two times a day (BID) | INTRAMUSCULAR | Status: DC
  Administered 2012-08-07 – 2012-08-08 (×2): 40 mg via INTRAVENOUS
  Filled 2012-08-07 (×4): qty 1

## 2012-08-07 MED ORDER — ALBUTEROL SULFATE (5 MG/ML) 0.5% IN NEBU: 2.5000 mg | INHALATION_SOLUTION | RESPIRATORY_TRACT | Status: DC | PRN

## 2012-08-07 NOTE — Progress Notes (Signed)
PULMONARY  / CRITICAL CARE MEDICINE  Name: Devon Glenn MRN: [id number] DOB: [birth year]    ADMISSION DATE:  08/04/2012 CONSULTATION DATE:  4-1  REFERRING MD :  [facility] PRIMARY SERVICE:    CHIEF COMPLAINT:  Cough, weight loss, hempotysis  SIGNIFICANT EVENTS / STUDIES:  4-1 see ct of chest report 4-2 FOB terminated early due to copious secretions and resp distress.     LINES / TUBES:   CULTURES: 4-1 bc x 2>> 4-1 uc>> negative 4-1 sputum>> 4-2 bronchial washings>>  ANTIBIOTICS: 4-1 zoysn>> 4-1 vanco>> 4-2 zithromax>>  HISTORY OF PRESENT ILLNESS:   57 yo AAM , life long 1 ppd smoker who quit 1 month ago. He reports 50 lb weight  Loss, cough x 1 months with intermittent scant blood.  He has e=recently been treated for Pna. He denies chest pain, orthopnea. CT of chest reveals a left sided mass. Pulmonary has been asked to evaluate for possible FOB and bx of lung mass.  SUBJECTIVE:  Still has cough, but not coughing up blood.  Denies chest pain.  Not bringing up as much sputum  VITAL SIGNS: Temp:  [97.1 F (36.2 C)-98.5 F (36.9 C)] 97.6 F (36.4 C) (04/03 0800) Pulse Rate:  [78-112] 94 (04/03 1000) Resp:  [15-40] 25 (04/03 1000) BP: (84-132)/(58-79) 102/64 mmHg (04/03 1000) SpO2:  [90 %-100 %] 92 % (04/03 1000)  PHYSICAL EXAMINATION: General:  Thin Neuro: Alert HEENT:  No LAN Cardiovascular: regular Lungs:  Decreased in bases left > right Abdomen:  Soft +bs Musculoskeletal:  intact Skin:  +lower ext edema   Recent Labs Lab 08/05/12 0515 08/06/12 0751 08/07/12 0335  NA 134* 134* 132*  K 3.7 3.8 3.9  CL 95* 96 94*  CO2 28 30 30   BUN 11 9 9   CREATININE 0.63 0.56 0.52  GLUCOSE 109* 94 155*    Recent Labs Lab 08/05/12 0515 08/06/12 0751 08/07/12 0335  HGB 8.7* 8.0* 8.3*  HCT 29.2* 26.3* 27.4*  WBC 12.3* 12.5* 12.4*  PLT 646* 570* 626*   Dg Chest Port 1 View  08/07/2012  *RADIOLOGY REPORT*  Clinical Data: Follow-up effusions.  PORTABLE CHEST - 1  VIEW  Comparison: 08/04/2012.  CT chest 08/05/2012.  The  Findings: Persistent opacity in the left lung base appears stable. There is interval increase of airspace and interstitial disease throughout both lungs suggesting developing pneumonia or edema. Heart size is increased.  No pneumothorax.  IMPRESSION: Stable appearance of large mass-like opacity in the left lung base. Increasing airspace and interstitial disease throughout both lungs suggesting developing edema or pneumonia.   Original Report Authenticated By: Burman Nieves, M.D.     ASSESSMENT / PLAN:  Lt hilar mass in setting of weight loss, chronic cough, hemoptysis and hx of smoking.  Limited bronchoscopy done 4/02 due to respiratory distress and respiratory secretions with concern for pneumonia. Plan: Okay to transfer out of ICU >> defer to [facility] Continue IV Abx F/u sputum/BAL culture results F/u BAL cytology Change solumedrol to 40 mg q12h Continue BD's, but change to q6h and q2h prn Continue bronchial hygiene   Updated family at bedside.  Coralyn Helling, MD [facility] Pulmonary/Critical Care 08/07/2012, 11:16 AM Pager:  [phone] After 3pm call: [phone]

## 2012-08-07 NOTE — Progress Notes (Signed)
[facility] PROGRESS NOTE  Devon Glenn ZOX:[id number] DOB: 1956/05/02 DOA: 08/04/2012 PCP: No primary provider on file.  Devon Glenn is a 57 yo M with MS who presents with cough and a 50-lb weight loss.  CT chest demonstrated a large left lung mass and PNA.  He was seen by PCCM and will underwent bronchoscopy on 4/2-he unfortunately desaturated to the 80's and had copious secretions and resp distress, however Bronchoscopic brushings were taken.  Nursing reports he cough's while he eats  Assessment/Plan  Acute hypoxic respiratory failure with SOB:  Likely a combination of enlarging mass and/or pneumonia. See below-.  Continue broad spectrum Abx CT neg for PE.  Health care associated pneumonia (hospital-acquired pneumonia) vs. Post-obstructive pneumonia -  Continue vancomycin and zosyn -  Continue atypical coverage with azithromycin 250 daily -  await urine legionella and s. pneumo ag negative -  Sputum culture is pending still -  No-growth on blood cultures 4.1.14 so far - will ask speech pathology to evaluate him for dysphagia and risk of aspiration  Lung mass, large and without significant mediastinal/hilar LAD.  Patient aware that he may have cancer.   - Pulmonary consulted-scheduled for BAL today -await cultures and preliminary washings/biopsy results  -Unable to get much  Info given a mass that was noted and patient de-sat to 80's with attempt at bronchoscopy.  Empiric nebs wit Albuterol/Atrovent and Solu-medrol started 4.2.14 when he desatted per pulmonary -await at least preliminary details and then will consult  Oncologist  Leg edema, severe, likely due to hypoalbuminemia 2/2 malignancy-associated wasting, but in setting of malignancy, agree with ruling out DVT -  Nutrition consult when starting diet again -  Dietary supplements  -  ECHO 4.1 =Ef60-65% with ? Wall motion anomaly -  UA for proteinuria showed microalbuminuria -  Lower extremity duplex 4.1 showed ruptured  baker's cyst -  Elevate lower extremities -  Add SCDs in the mean time  Hyponatremia - May be mildly intravascularly deplete or have some heart failure, however, I suspect he may have some SIADH from his pulmonary issues -  ECHO wnl -  LFTs wnl -  TSH not done, but as resolving, wouldn't persue furhter -  Gentle hydration -  Serum osms 278 [low], Uosm 802-probably 2/2 to potomania with low solute intake  Leukocytosis:  May be due to malignancy given no resolution despite bing on triple antibiotic coverage -steroids sarted 4.2 so might also be a cause -  Trend  Microcytic anemia - anemia panel- shows predominant iron deficiency -would transfuse blood if less than 7.0-rpt in am -  Hemoccult stool   Thrombocytosis:  May be due to malignancy or pneumonia.  Trending up -  Trend  Multiple sclerosis - patient unable to provide much history regarding this. -  Unclear history of this and unlikely at his age  Constitutional Hypotension -blood pressures drop to the 90's systolic on sleeping  Diet: regular diet Access:  PIV IVF:  OFF Proph:  lovenox to start post-procedure, SCDs now  Code Status: full code Family Communication: spoke with patient  Disposition Plan: pending medically stable.  PT consult prior to discharge.     Consultants:  pulmonary  Procedures:  CTa chest  Bronchoscopy 08/06/12  Antibiotics:  Vancomycin 4/1 >>  Zosyn 4/1 >>  Azithro 4/1 >>   HPI/Subjective: Feels better.  No n/v.  Tolerating diet a little better.  NO significant SOB.  NO fevers.  Coughing per him feels"muchbetter"   Nursing reports increase in  cough with eating.  Not passing stool and wishes to have a laxative.   Objective: Filed Vitals:   08/07/12 0325 08/07/12 0400 08/07/12 0500 08/07/12 0600  BP:  96/61 90/58 107/79  Pulse:  83 78 82  Temp:  98.4 F (36.9 C)    TempSrc:  Oral    Resp:  17 15 19   Height:      Weight:      SpO2: 100% 100% 100% 100%    Intake/Output  Summary (Last 24 hours) at 08/07/12 0804 Last data filed at 08/07/12 0500  Gross per 24 hour  Intake  262.5 ml  Output    800 ml  Net -537.5 ml   Filed Weights   08/05/12 0238  Weight: 59.875 kg (132 lb)    Exam:   General:  [name], No acute distress  HEENT:  NCAT, MMM  Cardiovascular:  RRR, nl S1, S2 no mrg, 2+ pulses, warm extremities  Respiratory:  .  No wheezes, rales, or rhonchi.  No increased WOB while on nasal canula.   Abdomen:   NABS, soft, NT/ND  MSK:   Normal tone and bulk-no edema  Neuro:  Grossly intact  Data Reviewed: Basic Metabolic Panel:  Recent Labs Lab 08/04/12 2349 08/05/12 0130 08/05/12 0515 08/06/12 0751 08/07/12 0335  NA 131*  --  134* 134* 132*  K 5.7* 3.8 3.7 3.8 3.9  CL 92*  --  95* 96 94*  CO2 28  --  28 30 30   GLUCOSE 129*  --  109* 94 155*  BUN 14  --  11 9 9   CREATININE 0.54  --  0.63 0.56 0.52  CALCIUM 8.8  --  8.6 8.3* 8.7  MG  --   --   --   --  2.2  PHOS  --   --   --   --  4.0   Liver Function Tests:  Recent Labs Lab 08/05/12 0515  AST 8  ALT <5  ALKPHOS 91  BILITOT 0.3  PROT 6.8  ALBUMIN 1.8*   No results found for this basename: LIPASE, AMYLASE,  in the last 168 hours No results found for this basename: AMMONIA,  in the last 168 hours CBC:  Recent Labs Lab 08/04/12 2349 08/05/12 0515 08/06/12 0751 08/07/12 0335  WBC 12.9* 12.3* 12.5* 12.4*  NEUTROABS  --  9.4*  --   --   HGB 8.7* 8.7* 8.0* 8.3*  HCT 29.1* 29.2* 26.3* 27.4*  MCV 77.4* 78.5 78.5 78.1  PLT 587* 646* 570* 626*   Cardiac Enzymes:  Recent Labs Lab 08/05/12 0130  TROPONINI <0.30   BNP (last 3 results)  Recent Labs  08/04/12 2349  PROBNP 377.7*   CBG: No results found for this basename: GLUCAP,  in the last 168 hours  Recent Results (from the past 240 hour(s))  CULTURE, BLOOD (ROUTINE X 2)     Status: None   Collection Time    08/05/12  1:20 AM      Result Value Range Status   Specimen Description BLOOD RIGHT WRIST    Final   Special Requests BOTTLES DRAWN AEROBIC AND ANAEROBIC 5CC   Final   Culture  Setup Time 08/05/2012 08:41   Final   Culture     Final   Value:        BLOOD CULTURE RECEIVED NO GROWTH TO DATE CULTURE WILL BE HELD FOR 5 DAYS BEFORE ISSUING A FINAL NEGATIVE REPORT   Report Status PENDING  Incomplete  CULTURE, BLOOD (ROUTINE X 2)     Status: None   Collection Time    08/05/12  1:30 AM      Result Value Range Status   Specimen Description BLOOD LEFT HAND   Final   Special Requests BOTTLES DRAWN AEROBIC AND ANAEROBIC 5CC   Final   Culture  Setup Time 08/05/2012 08:41   Final   Culture     Final   Value:        BLOOD CULTURE RECEIVED NO GROWTH TO DATE CULTURE WILL BE HELD FOR 5 DAYS BEFORE ISSUING A FINAL NEGATIVE REPORT   Report Status PENDING   Incomplete  URINE CULTURE     Status: None   Collection Time    08/05/12  2:14 PM      Result Value Range Status   Specimen Description URINE, CLEAN CATCH   Final   Special Requests NONE   Final   Culture  Setup Time 08/05/2012 21:17   Final   Colony Count NO GROWTH   Final   Culture NO GROWTH   Final   Report Status 08/06/2012 FINAL   Final  CULTURE, EXPECTORATED SPUTUM-ASSESSMENT     Status: None   Collection Time    08/05/12 10:56 PM      Result Value Range Status   Specimen Description SPUTUM   Final   Special Requests Normal   Final   Sputum evaluation     Final   Value: THIS SPECIMEN IS ACCEPTABLE. RESPIRATORY CULTURE REPORT TO FOLLOW.   Report Status 08/05/2012 FINAL   Final  CULTURE, RESPIRATORY (NON-EXPECTORATED)     Status: None   Collection Time    08/05/12 10:56 PM      Result Value Range Status   Specimen Description SPUTUM   Final   Special Requests NONE   Final   Gram Stain     Final   Value: RARE WBC PRESENT, PREDOMINANTLY PMN     RARE SQUAMOUS EPITHELIAL CELLS PRESENT     RARE GRAM POSITIVE RODS     RARE GRAM POSITIVE COCCI     IN PAIRS   Culture PENDING   Incomplete   Report Status PENDING   Incomplete   MRSA PCR SCREENING     Status: Abnormal   Collection Time    08/06/12  1:29 PM      Result Value Range Status   MRSA by PCR POSITIVE (*) NEGATIVE Final   Comment:            The GeneXpert MRSA Assay (FDA     approved for NASAL specimens     only), is one component of a     comprehensive MRSA colonization     surveillance program. It is not     intended to diagnose MRSA     infection nor to guide or     monitor treatment for     MRSA infections.     RESULT CALLED TO, READ BACK BY AND VERIFIED WITH:     Arrie Senate ON [id number] AT 1625 BY INGLEE     Studies: Dg Chest [facility] 1 View  08/07/2012  *RADIOLOGY REPORT*  Clinical Data: Follow-up effusions.  PORTABLE CHEST - 1 VIEW  Comparison: 08/04/2012.  CT chest 08/05/2012.  The  Findings: Persistent opacity in the left lung base appears stable. There is interval increase of airspace and interstitial disease throughout both lungs suggesting developing pneumonia or edema. Heart size is increased.  No pneumothorax.  IMPRESSION:  Stable appearance of large mass-like opacity in the left lung base. Increasing airspace and interstitial disease throughout both lungs suggesting developing edema or pneumonia.   Original Report Authenticated By: Burman Nieves, M.D.     Scheduled Meds: . albuterol  2.5 mg Nebulization Q4H  . azithromycin  250 mg Oral Daily  . benzonatate  200 mg Oral TID  . Chlorhexidine Gluconate Cloth  6 each Topical Q0600  . dextromethorphan-guaiFENesin  1 tablet Oral BID  . enoxaparin (LOVENOX) injection  40 mg Subcutaneous Q24H  . feeding supplement  237 mL Oral TID WC  . ipratropium  0.5 mg Nebulization Q6H  . iron polysaccharides  150 mg Oral BID  . methylPREDNISolone (SOLU-MEDROL) injection  80 mg Intravenous Q6H  . multivitamin with minerals  1 tablet Oral Daily  . mupirocin ointment  1 application Nasal BID  . pantoprazole  40 mg Oral Daily  . piperacillin-tazobactam (ZOSYN)  IV  3.375 g Intravenous Q8H  . vancomycin  1,000  mg Intravenous Q12H   Continuous Infusions:   Active Problems:   Leg edema   Hyponatremia   Anemia   HAP (hospital-acquired pneumonia)   Multiple sclerosis   Hemoptysis   Cavitating mass in left lower lung lobe    Time spent: 30 min    Mahala Menghini [facility]  [facility] Pager [phone].  If 7PM-7AM, please contact night-coverage at [email or web address], password [facility] 08/07/2012, 8:04 AM  LOS: 3 days

## 2012-08-07 NOTE — Progress Notes (Signed)
[id number] Earlene Plater, RN, BSN, CCM:  CHART REVIEWED AND UPDATED.  Next chart review due on [id number]. NO DISCHARGE NEEDS PRESENT AT THIS TIME. CASE MANAGEMENT [phone]

## 2012-08-07 NOTE — Evaluation (Signed)
Clinical/Bedside Swallow Evaluation Patient Details  Name: Devon Glenn MRN: [id number] Date of Birth: 1956/02/02  Today's Date: 08/07/2012 Time: 4098-1191 SLP Time Calculation (min): 29 min  Past Medical History:  Past Medical History  Diagnosis Date  . Multiple sclerosis    Past Surgical History:  Past Surgical History  Procedure Laterality Date  . Lipoma removal    . Video bronchoscopy N/A 08/06/2012    Procedure: VIDEO BRONCHOSCOPY WITH FLUORO;  Surgeon: Storm Frisk, MD;  Location: [facility] ENDOSCOPY;  Service: Cardiopulmonary;  Laterality: N/A;   HPI:  57 yo male adm to [facility] with cough, weight loss, hemoptysis.  Pt found to have left sided mass, 4/2 bronchial washing - terminated secondary to secretions and oxygen desaturation.  Pt has diagnsos of MS which he states is new, leg edema, HAP, anemia and hyponatremia.  CXR 08/07/12 showed developing edema versus pna.  Pt is on a PPI and had been during previous admit to hospital at [facility] 05/2012.  Previous smoker per notes.  Pt with baseline cough which he states does not worsen with intake.     Assessment / Plan / Recommendation Clinical Impression  Pt appears with a mild oral and suspected functional pharyngeal swallow ability without clinical indications of aspiration or severe dysphagia with any consistency tested.   Oral suction set up at bedside for pt to utllize to clear secretions he is able to expectorate.  Speech is mildly dysarthric and pt noted to have minimal difficulties masticating/orally transiting cracker bolus.  He was able to self feed, although consumed at rapid rate.  Pt denies any symptoms of esophageal issues/sensation of stasis, etc and states his current swallow ability is better than prior to admit.  Voice remained clear throughout entire exam.    Advise to continue diet with strict aspiration precautions.  Given pt carries an MS diagnosis, he may have sensory impairments (decr pharyngeal/laryngeal sensation)  that are not evident via clinical swallow evaluation.     SLP to follow brielfy to help to mitigate pt's mild dysphagia/aspiration risk and to determine if/when MBS becomes clinically indicated.   SLP educated pt to aspiration precautions and importance of oral care.  Also helped pt to brush his teeth to implement goals.  Pt agreeable to follow up with SLP.  Thanks for this consult.      Aspiration Risk  Mild    Diet Recommendation Regular;Thin liquid   Liquid Administration via: Cup;Straw Medication Administration: Whole meds with liquid (as tolerated) Supervision: Patient able to self feed Compensations: Slow rate;Small sips/bites;Check for pocketing (rest breaks if short of breath or coughing) Postural Changes and/or Swallow Maneuvers: Seated upright 90 degrees;Upright 30-60 min after meal    Other  Recommendations Oral Care Recommendations: Oral care QID   Follow Up Recommendations       Frequency and Duration min 2x/week  2 weeks   Pertinent Vitals/Pain Afebrile, decreased left more than right    SLP Swallow Goals Patient will utilize recommended strategies during swallow to increase swallowing safety with: Minimal cueing   Swallow Study Prior Functional Status   uncertain    General HPI: 57 yo male adm to [facility] with cough, weight loss, hemoptysis.  Pt found to have lung mass, 4/2 bronchial washing attempted - terminated secondary to secretions and oxygen desaturation.  Pt has diagnsos of MS which he states is new, leg edema, HAP, anemia and hyponatremia.  CXR 08/07/12 showed developing edema versus pna.  Pt is on a PPI and had been  during previous admit to hospital at [facility] 05/2012.  Previous smoker per notes.  Pt with baseline cough which he states does not worsen with intake.   Type of Study: Bedside swallow evaluation Previous Swallow Assessment: none  Diet Prior to this Study: Regular;Thin liquids Temperature Spikes Noted: No Respiratory Status: Supplemental O2  delivered via (comment) History of Recent Intubation: No Behavior/Cognition: Alert;Cooperative;Pleasant mood Oral Cavity - Dentition: Adequate natural dentition Self-Feeding Abilities: Able to feed self Patient Positioning: Upright in bed Baseline Vocal Quality: Clear Volitional Cough: Strong Volitional Swallow: Able to elicit    Oral/Motor/Sensory Function Overall Oral Motor/Sensory Function:  (mildly dysarthric speech, general weakness, no focal cn defi)   Ice Chips Ice chips: Not tested   Thin Liquid Thin Liquid: Within functional limits Presentation: Cup;Self Fed;Straw    Nectar Thick Nectar Thick Liquid: Not tested   Honey Thick Honey Thick Liquid: Not tested   Puree Puree: Within functional limits Presentation: Self Fed;Spoon   Solid   GO    Solid: Impaired Presentation: Self Fed Oral Phase Impairments: Impaired anterior to posterior transit;Reduced lingual movement/coordination Oral Phase Functional Implications: Oral residue (delayed oral transit, mild oral residuals - liquids clear)       Devon Burnet, MS [facility] SLP [phone]

## 2012-08-07 NOTE — Clinical Social Work Psychosocial (Signed)
Clinical Social Work Department BRIEF PSYCHOSOCIAL ASSESSMENT 08/07/2012  Patient:  Devon Glenn, Devon Glenn     Account Number:  [id number]     Admit date:  08/04/2012  Clinical Social Worker:  Jodelle Red  Date/Time:  08/07/2012 10:54 AM  Referred by:  CSW  Date Referred:  08/06/2012 Referred for  Other - See comment   Other Referral:   FINANCIAL CONCERNS   Interview type:  Family Other interview type:   CHART REVIEW    PSYCHOSOCIAL DATA Living Status:  FAMILY Admitted from facility:   Level of care:   Primary support name:  [name], JR Primary support relationship to patient:  CHILD, ADULT Degree of support available:   GOOD FROM SON    CURRENT CONCERNS Current Concerns  Adjustment to Illness  Financial Resources  Post-Acute Placement   Other Concerns:    SOCIAL WORK ASSESSMENT / PLAN CSW met with Pt and son and then son alone. Son, Tammy Sours has many concerns about his father and his ability to provide self care. Pt was at home alone in [place] with no heat per son. Son went to get his dad in Feb. and bring him home to [place] to help him. Pt was at a SNF prior to then and then disharged home alone. Son very frustrated as he wants to help his dad and his dad is resistant to help. Son reports Pt's house is about to go into foreclosure and has financial concerns.  Son feels Pt will need SNF placement after this hospitalization. He reports his dad does have insurance and receives disability. Pt is currently listed as self pay.   Assessment/plan status:  Psychosocial Support/Ongoing Assessment of Needs Other assessment/ plan:   referral made to financial counselor.  assist with possible SNF placement   Information/referral to community resources:   Son needs assistance learning local resources for older adults.    PATIENT'S/FAMILY'S RESPONSE TO PLAN OF CARE: Pt very frusterated with his illness. Pt short with his son and CSW when CSW in the room. Pt has not  been seen by PT to date, but son feels he cannot be at home with Pt 24/7 and that his dad is very weak. CSW provided support to family and made referrals to local resources. CSW will assist with SNF placement if indicated. Fin counselor to see today to assist with insurance verification.    Doreen Salvage, LCSW ICU/Stepdown Clinical Social Worker [facility] Cell [phone] Hours 8am-1200pm M-F

## 2012-08-07 NOTE — Progress Notes (Signed)
Nutrition Education Note  RD consulted due to pt's son request regarding his fathers dietary needs. Son has noticed a significant drop in pt's appetite over past 3 weeks.  RD provided "High Calorie High Protein Nutrition Therapy" handout from the Academy of [employer] Diet[employer] to pt's son. Discussed the importance of preventing further weight loss and getting adequate nutrition. Provided examples on ways to add protein and calorie intake in diet. Encouraged smaller more frequent meals due to patient's poor appetite and getting full fast. Recommended supplements on days when pt has poor po intake and recommend that pt take a multivitamin and vitamin D supplement daily. Discouraged the use of Similac infant formula and provided information on age-appropriate nutrition supplements. Son states he cooks with olive oil and RD reinforced this as a good habit. Recommended that pt be weighed at least once a month after discharge to check if pt is still losing weight; son can increase supplements, protein, and calories in diet if weight loss continues.  Teach back method used. Expect good compliance.  Body mass index is 17.42 kg/(m^2). Pt meets criteria for Underweight based on current BMI.  Current diet order is Heart Healthy, patient is consuming approximately 60-100% of meals at this time. Labs and medications reviewed. No further nutrition interventions warranted at this time. RD contact information provided. If additional nutrition issues arise, please re-consult RD.  Ian Malkin RD, LDN Inpatient Clinical Dietitian Pager: [phone] After Hours Pager: [phone]

## 2012-08-08 ENCOUNTER — Inpatient Hospital Stay (HOSPITAL_COMMUNITY): Payer: BC Managed Care – PPO

## 2012-08-08 LAB — CULTURE, RESPIRATORY W GRAM STAIN: Culture: NORMAL

## 2012-08-08 LAB — CBC
MCV: 78.3 fL (ref 78.0–100.0)
Platelets: 648 10*3/uL — ABNORMAL HIGH (ref 150–400)
RBC: 3.27 MIL/uL — ABNORMAL LOW (ref 4.22–5.81)
RDW: 17.3 % — ABNORMAL HIGH (ref 11.5–15.5)
WBC: 9.8 10*3/uL (ref 4.0–10.5)

## 2012-08-08 LAB — BASIC METABOLIC PANEL
CO2: 32 mEq/L (ref 19–32)
Calcium: 8.7 mg/dL (ref 8.4–10.5)
Creatinine, Ser: 0.63 mg/dL (ref 0.50–1.35)
GFR calc non Af Amer: >90 mL/min (ref >90)
Sodium: 133 mEq/L — ABNORMAL LOW (ref 135–145)

## 2012-08-08 MED ORDER — BENZONATATE 100 MG PO CAPS
200.0000 mg | ORAL_CAPSULE | Freq: Two times a day (BID) | ORAL | Status: DC | PRN
  Administered 2012-08-11: 200 mg via ORAL
  Filled 2012-08-08: qty 2

## 2012-08-08 MED ORDER — PREDNISONE 20 MG PO TABS
30.0000 mg | ORAL_TABLET | Freq: Every day | ORAL | Status: DC
  Administered 2012-08-09 – 2012-08-11 (×3): 30 mg via ORAL
  Filled 2012-08-08 (×4): qty 1

## 2012-08-08 MED ORDER — DM-GUAIFENESIN ER 30-600 MG PO TB12
1.0000 | ORAL_TABLET | Freq: Two times a day (BID) | ORAL | Status: DC | PRN
  Administered 2012-08-14: 1 via ORAL
  Filled 2012-08-08: qty 1

## 2012-08-08 NOTE — Progress Notes (Signed)
Speech Language Pathology Dysphagia Treatment Patient Details Name: Devon Glenn MRN: [id number] DOB: 20-Nov-1955 Today's Date: 08/08/2012 Time: 7829-5621 SLP Time Calculation (min): 35 min  Assessment / Plan / Recommendation Clinical Impression  Pt seen for skilled dysphagia treatment, observed pt consuming lunch.  No clinical indicators of aspiration noted, Pt was observed to take large bolus sizes but responded to verbal cues to take small bites.  Intake listed as 100% and pt reports swallow is much improved compared to prior to admission.  Wet voice x1 prior to po initiation noted, cleared with cued cough- educated pt to clear his throat/cough to remove secretions if wet phonation noted.    Unless MD desires MBS to assure silent dysphagia not present, SLP will sign off as all education is completed and pt appears to be tolerating his po.  Thanks.        Diet Recommendation  Continue with Current Diet: Regular;Thin liquid    SLP Plan   no follow up, unless MBS desired  Pertinent Vitals/Pain Afebrile, decreased, Dg Chest 2 View  08/08/2012  **ADDENDUM** CREATED: 08/08/2012 09:07:22  Findings discussed with Dr. Mahala Menghini  08/08/2012 at 9:05.  **END ADDENDUM** SIGNED BY: Genevive Bi, M.D.   08/08/2012  *RADIOLOGY REPORT*  Clinical Data: Follow-up left lung mass  CHEST - 2 VIEW  Comparison: Chest radiograph 08/07/2012  Findings: Stable enlarged cardiac silhouette. There is a large rounded left lower lobe mass like density again demonstrated. There is a small locule of gas within this masslike fluid collection seen on lateral projection.  There is right basilar atelectasis which is similar.  There is some improvement air space disease seen on prior.  There is a bullous change at the right lung apex which is confirmed on comparison CT.  On CT exam would include empyema and pulmonary abscess in the differential.  IMPRESSION:  1.  Persistent large left  pleural abscess versus empyema versus mass.  2.   Improvement in right lower lobe air space disease.   Original Report Authenticated By: Genevive Bi, M.D.    Dg Chest Port 1 View  08/07/2012  *RADIOLOGY REPORT*  Clinical Data: Follow-up effusions.  PORTABLE CHEST - 1 VIEW  Comparison: 08/04/2012.  CT chest 08/05/2012.  The  Findings: Persistent opacity in the left lung base appears stable. There is interval increase of airspace and interstitial disease throughout both lungs suggesting developing pneumonia or edema. Heart size is increased.  No pneumothorax.  IMPRESSION: Stable appearance of large mass-like opacity in the left lung base. Increasing airspace and interstitial disease throughout both lungs suggesting developing edema or pneumonia.   Original Report Authenticated By: Burman Nieves, M.D.      Swallowing Goals  SLP Swallowing Goals Swallow Study Goal #2 - Progress: Met  General Temperature Spikes Noted: No Respiratory Status: Room air Behavior/Cognition: Cooperative;Alert;Pleasant mood Patient Positioning: Upright in chair  Oral Cavity - Oral Hygiene     Dysphagia Treatment Treatment focused on: Skilled observation of diet tolerance;Patient/family/caregiver education Family/Caregiver Educated: pt Treatment Methods/Modalities: Skilled observation Patient observed directly with PO's: Yes Type of PO's observed: Regular;Thin liquids Feeding: Able to feed self Type of cueing: Verbal (to take small bites/sips) Amount of cueing: Minimal   GO     Devon Burnet, MS [facility] SLP [phone]

## 2012-08-08 NOTE — Progress Notes (Signed)
PULMONARY  / CRITICAL CARE MEDICINE  Name: Devon Glenn MRN: [id number] DOB: 12-24-1955    ADMISSION DATE:  08/04/2012 CONSULTATION DATE:  4-1  REFERRING MD :  [facility] PRIMARY SERVICE:    CHIEF COMPLAINT:  Cough, weight loss, hempotysis  SIGNIFICANT EVENTS / STUDIES:  4-1 see ct of chest report 4-2 FOB terminated early due to copious secretions and resp distress.     CULTURES: 4-1 bc x 2>> 4-1 uc>> negative 4-1 sputum>> oral flora 4-2 bronchial washings >> oral flora  ANTIBIOTICS: 4-1 zoysn>> 4-1 vanco>> 4/04 4-2 zithromax>> 4/04  HISTORY OF PRESENT ILLNESS:   57 yo AAM , life long 1 ppd smoker who quit 1 month ago. He reports 50 lb weight  Loss, cough x 1 months with intermittent scant blood.  He has e=recently been treated for Pna. He denies chest pain, orthopnea. CT of chest reveals a left sided mass. Pulmonary has been asked to evaluate for possible FOB and bx of lung mass.  SUBJECTIVE:  Feels better.  Denies chest pain.  Cough decreased.  VITAL SIGNS: Temp:  [97.3 F (36.3 C)-97.9 F (36.6 C)] 97.3 F (36.3 C) (04/04 0707) Pulse Rate:  [85-99] 85 (04/04 0707) Resp:  [16-25] 16 (04/04 0707) BP: (100-132)/(59-78) 132/78 mmHg (04/04 0707) SpO2:  [92 %-100 %] 99 % (04/04 0707)  PHYSICAL EXAMINATION: General:  Thin Neuro: Alert HEENT:  No LAN Cardiovascular: regular Lungs:  Decreased in bases left > right Abdomen:  Soft +bs Musculoskeletal:  intact Skin:  +lower ext edema   Recent Labs Lab 08/06/12 0751 08/07/12 0335 08/08/12 0453  NA 134* 132* 133*  K 3.8 3.9 3.8  CL 96 94* 94*  CO2 30 30 32  BUN 9 9 17   CREATININE 0.56 0.52 0.63  GLUCOSE 94 155* 147*    Recent Labs Lab 08/06/12 0751 08/07/12 0335 08/08/12 0453  HGB 8.0* 8.3* 7.6*  HCT 26.3* 27.4* 25.6*  WBC 12.5* 12.4* 9.8  PLT 570* 626* 648*   Dg Chest 2 View  08/08/2012  **ADDENDUM** CREATED: 08/08/2012 09:07:22  Findings discussed with Dr. Mahala Menghini  08/08/2012 at 9:05.  **END ADDENDUM**  SIGNED BY: Genevive Bi, M.D.   08/08/2012  *RADIOLOGY REPORT*  Clinical Data: Follow-up left lung mass  CHEST - 2 VIEW  Comparison: Chest radiograph 08/07/2012  Findings: Stable enlarged cardiac silhouette. There is a large rounded left lower lobe mass like density again demonstrated. There is a small locule of gas within this masslike fluid collection seen on lateral projection.  There is right basilar atelectasis which is similar.  There is some improvement air space disease seen on prior.  There is a bullous change at the right lung apex which is confirmed on comparison CT.  On CT exam would include empyema and pulmonary abscess in the differential.  IMPRESSION:  1.  Persistent large left  pleural abscess versus empyema versus mass.  2.  Improvement in right lower lobe air space disease.   Original Report Authenticated By: Genevive Bi, M.D.    Dg Chest Port 1 View  08/07/2012  *RADIOLOGY REPORT*  Clinical Data: Follow-up effusions.  PORTABLE CHEST - 1 VIEW  Comparison: 08/04/2012.  CT chest 08/05/2012.  The  Findings: Persistent opacity in the left lung base appears stable. There is interval increase of airspace and interstitial disease throughout both lungs suggesting developing pneumonia or edema. Heart size is increased.  No pneumothorax.  IMPRESSION: Stable appearance of large mass-like opacity in the left lung base. Increasing airspace and interstitial  disease throughout both lungs suggesting developing edema or pneumonia.   Original Report Authenticated By: Burman Nieves, M.D.     ASSESSMENT / PLAN:  Lt hilar mass in setting of weight loss, chronic cough, hemoptysis and hx of smoking.  Limited bronchoscopy done 4/02 due to respiratory distress and respiratory secretions with concern for pneumonia and lung abscess.  Cytology from bronchoscopy negative Plan: Continue IV Abx >> will d/c vancomycin, zithromax D/c solumedrol and change to prednisone 30 mg daily, and taper off as  tolerated Continue BD's, but changed to q6h and q2h prn Continue bronchial hygiene F/u CXR 4/07, or sooner if he develops increased respiratory symptoms May need repeat bronchoscopy if CXR does not improve further   PCCM will be available as needed over weekend, and follow up on 4/07.    Coralyn Helling, MD [facility] Pulmonary/Critical Care 08/08/2012, 9:57 AM Pager:  [phone] After 3pm call: [phone]

## 2012-08-08 NOTE — Progress Notes (Signed)
ANTIBIOTIC CONSULT NOTE - follow-up  Pharmacy Consult for zosyn Indication: HAP (? Lung abscess)  Allergies  Allergen Reactions  . Penicillins     unknown    Patient Measurements: Height: 6\' 1"  (185.4 cm) Weight: 132 lb (59.875 kg) IBW/kg (Calculated) : 79.9   Vital Signs: Temp: 97.3 F (36.3 C) (04/04 0707) Temp src: Oral (04/04 0707) BP: 132/78 mmHg (04/04 0707) Pulse Rate: 85 (04/04 0707) Intake/Output from previous day: 04/03 0701 - 04/04 0700 In: 340 [P.O.:240; IV Piggyback:100] Out: 700 [Urine:700] Intake/Output from this shift: Total I/O In: 120 [P.O.:120] Out: 375 [Urine:375]  Labs:  Recent Labs  08/05/12 1414 08/06/12 0751 08/07/12 0335 08/08/12 0453  WBC  --  12.5* 12.4* 9.8  HGB  --  8.0* 8.3* 7.6*  PLT  --  570* 626* 648*  LABCREA 176.56  --   --   --   CREATININE  --  0.56 0.52 0.63   Estimated Creatinine Clearance: 85.3 ml/min (by C-G formula based on Cr of 0.63). No results found for this basename: VANCOTROUGH, Leodis Binet, VANCORANDOM, GENTTROUGH, GENTPEAK, GENTRANDOM, TOBRATROUGH, TOBRAPEAK, TOBRARND, AMIKACINPEAK, AMIKACINTROU, AMIKACIN,  in the last 72 hours   Microbiology: Recent Results (from the past 720 hour(s))  CULTURE, BLOOD (ROUTINE X 2)     Status: None   Collection Time    08/05/12  1:20 AM      Result Value Range Status   Specimen Description BLOOD RIGHT WRIST   Final   Special Requests BOTTLES DRAWN AEROBIC AND ANAEROBIC 5CC   Final   Culture  Setup Time 08/05/2012 08:41   Final   Culture     Final   Value:        BLOOD CULTURE RECEIVED NO GROWTH TO DATE CULTURE WILL BE HELD FOR 5 DAYS BEFORE ISSUING A FINAL NEGATIVE REPORT   Report Status PENDING   Incomplete  CULTURE, BLOOD (ROUTINE X 2)     Status: None   Collection Time    08/05/12  1:30 AM      Result Value Range Status   Specimen Description BLOOD LEFT HAND   Final   Special Requests BOTTLES DRAWN AEROBIC AND ANAEROBIC 5CC   Final   Culture  Setup Time 08/05/2012  08:41   Final   Culture     Final   Value:        BLOOD CULTURE RECEIVED NO GROWTH TO DATE CULTURE WILL BE HELD FOR 5 DAYS BEFORE ISSUING A FINAL NEGATIVE REPORT   Report Status PENDING   Incomplete  URINE CULTURE     Status: None   Collection Time    08/05/12  2:14 PM      Result Value Range Status   Specimen Description URINE, CLEAN CATCH   Final   Special Requests NONE   Final   Culture  Setup Time 08/05/2012 21:17   Final   Colony Count NO GROWTH   Final   Culture NO GROWTH   Final   Report Status 08/06/2012 FINAL   Final  CULTURE, EXPECTORATED SPUTUM-ASSESSMENT     Status: None   Collection Time    08/05/12 10:56 PM      Result Value Range Status   Specimen Description SPUTUM   Final   Special Requests Normal   Final   Sputum evaluation     Final   Value: THIS SPECIMEN IS ACCEPTABLE. RESPIRATORY CULTURE REPORT TO FOLLOW.   Report Status 08/05/2012 FINAL   Final  CULTURE, RESPIRATORY (NON-EXPECTORATED)  Status: None   Collection Time    08/05/12 10:56 PM      Result Value Range Status   Specimen Description SPUTUM   Final   Special Requests NONE   Final   Gram Stain     Final   Value: RARE WBC PRESENT, PREDOMINANTLY PMN     RARE SQUAMOUS EPITHELIAL CELLS PRESENT     RARE GRAM POSITIVE RODS     RARE GRAM POSITIVE COCCI     IN PAIRS   Culture NORMAL OROPHARYNGEAL FLORA   Final   Report Status 08/08/2012 FINAL   Final  CULTURE, RESPIRATORY (NON-EXPECTORATED)     Status: None   Collection Time    08/06/12 10:26 AM      Result Value Range Status   Specimen Description BRONCHIAL WASHINGS   Final   Special Requests Normal   Final   Gram Stain     Final   Value: FEW WBC PRESENT,BOTH PMN AND MONONUCLEAR     NO SQUAMOUS EPITHELIAL CELLS SEEN     RARE GRAM POSITIVE COCCI     IN PAIRS IN CHAINS   Culture Non-Pathogenic Oropharyngeal-type Flora Isolated.   Final   Report Status 08/08/2012 FINAL   Final  MRSA PCR SCREENING     Status: Abnormal   Collection Time     08/06/12  1:29 PM      Result Value Range Status   MRSA by PCR POSITIVE (*) NEGATIVE Final   Comment:            The GeneXpert MRSA Assay (FDA     approved for NASAL specimens     only), is one component of a     comprehensive MRSA colonization     surveillance program. It is not     intended to diagnose MRSA     infection nor to guide or     monitor treatment for     MRSA infections.     RESULT CALLED TO, READ BACK BY AND VERIFIED WITH:     CHEEK, K ON [id number] AT 1625 BY INGLEE    Medical History: Past Medical History  Diagnosis Date  . Multiple sclerosis     Medications:  Scheduled:  . albuterol  2.5 mg Nebulization Q6H  . Chlorhexidine Gluconate Cloth  6 each Topical Q0600  . enoxaparin (LOVENOX) injection  40 mg Subcutaneous Q24H  . feeding supplement  237 mL Oral TID WC  . ipratropium  0.5 mg Nebulization Q6H  . iron polysaccharides  150 mg Oral BID  . multivitamin with minerals  1 tablet Oral Daily  . mupirocin ointment  1 application Nasal BID  . pantoprazole  40 mg Oral Daily  . piperacillin-tazobactam (ZOSYN)  IV  3.375 g Intravenous Q8H  . [START ON 08/09/2012] predniSONE  30 mg Oral Q breakfast  . senna-docusate  1 tablet Oral BID  . [DISCONTINUED] azithromycin  250 mg Oral Daily  . [DISCONTINUED] benzonatate  200 mg Oral TID  . [DISCONTINUED] dextromethorphan-guaiFENesin  1 tablet Oral BID  . [DISCONTINUED] methylPREDNISolone (SOLU-MEDROL) injection  40 mg Intravenous Q12H  . [DISCONTINUED] vancomycin  1,000 mg Intravenous Q12H   Infusions:   Assessment: 57 yo with history of MS- recently hospitalized with PNA. MD ordering Zosyn (despite PCN allergy- pt tolerated 1st dose in ER).  S/p bronchoscopy 4/2.   4/1 >>zosyn >> 4/1 >>vanc >> 4/4 4/1 >>azith >>4/4  Tmax: Afeb WBCs:9.8 Renal: SCr WNL CrCl 85, N >100  4/1 blood x  2: NGTD 4/1 urine: NG 4/1 sputum: Nl flora 4/2 BAL: nl flora  Goal of Therapy:  Vancomycin trough level 15-20 mcg/ml  Plan:    Continue Zosyn 3.375 Gm IV q8h EI infusion, appears to be tolerating  Vancomycin and azithromycin d/c'd  Follow at distance and adjust zosyn as needed   Dannielle Huh 08/08/2012,11:49 AM

## 2012-08-08 NOTE — Progress Notes (Signed)
[facility] PROGRESS NOTE  Devon Glenn WUJ:[id number] DOB: 06/08/1955 DOA: 08/04/2012 PCP: No primary provider on file.  Devon Glenn is a 57 yo M with MS who presents with cough and a 50-lb weight loss.  CT chest demonstrated a large left lung mass and PNA.  He was seen by PCCM and will underwent bronchoscopy on 4/2-he unfortunately desaturated to the 80's and had copious secretions and resp distress, however Bronchoscopic brushings were taken.  Nursing reports he cough's while he eats HE became much less Hypoxic and seemd a lot more stable over period from 42-4.4 and his resoiartory status imporved.  He also was found to have no specific indicators that this was malignancy on his brocnhial brushings, and it was noted on rpt Xray his mass in the lung muight be an abcess moreso than malgnancy.  Assessment/Plan  Acute hypoxic respiratory failure with SOB:  Likely a combination of enlarging mass and/or pneumonia. See below-Continue broad spectrum Abx CT neg for PE.  Health care associated pneumonia (hospital-acquired pneumonia) vs. Post-obstructive pneumonia -  Vancomycin and zosyn with azithromycin 250 daily narrowed to Zosyn onotherapy 4.4.14 -  urine legionella and s. pneumo ag negative -  Sputum culture showing only non-pathogenic flora -  No-growth on blood cultures 4.1.14 so far - SLP evaluated 4.3-no overt signs of aspiration noted -Appreciate pulmonary input into case and further follow-up -Will repeat chest x-ray 08/12/2038  Lung mass, large and without significant mediastinal/hilar LAD--More likely Empyema/Abcess .   - Pulmonary consulted-scheduled for BAL today -preliminary washings/biopsy results show only inflammation -Unable to get much given a mass that was noted and patient de-sat to 80's with attempt at bronchoscopy.    Leg edema, severe, likely due to hypoalbuminemia 2/2  -  Nutrition consult when starting diet again -  Dietary supplements  -  ECHO 4.1 =Ef60-65%  with ? Wall motion anomaly -  UA for proteinuria showed microalbuminuria -  Lower extremity duplex 4.1 showed ruptured baker's cyst -  Elevate lower extremities -  Add SCDs in the mean time  Hyponatremia - ?SIADH from his pulmonary issues -  ECHO wnl -  LFTs wnl -  TSH not done, but as resolving, wouldn't persue furhter -  Gentle hydration -  Serum osms 278 [low], Uosm 802-probably 2/2 to potomania with low solute intake  Leukocytosis:  resolving -steroids started 4.2 so might also be a cause -  Trend  Microcytic anemia - anemia panel- shows predominant iron deficiency -would transfuse blood if less than 7.0-rpt in am -  Hemoccult stool   Thrombocytosis:  May be due to malignancy or pneumonia.  Trending up -  Trend  Multiple sclerosis - patient unable to provide much history regarding this. -  Unclear history of this and unlikely at his age  Constitutional Hypotension -blood pressures drop to the 90's systolic on sleeping  Diet: regular diet Access:  PIV IVF:  OFF Proph:  lovenox to start post-procedure, SCDs now  Code Status: full code Family Communication: spoke with patient  Disposition Plan: pending medically stable.  PT consult prior to discharge.     Consultants:  pulmonary  Procedures:  CTa chest  Bronchoscopy 08/06/12  Antibiotics:  Vancomycin 4/1 >>4.4  Zosyn 4/1 >>  Azithro 4/1 >> 4.4  HPI/Subjective: Patient looks much better. He states he feels well. No other further issues.   Objective: Filed Vitals:   08/07/12 2115 08/08/12 0046 08/08/12 0237 08/08/12 0707  BP:  100/67  132/78  Pulse:  86  85  Temp:  97.9 F (36.6 C)  97.3 F (36.3 C)  TempSrc:  Oral  Oral  Resp:  16 22 16   Height:      Weight:      SpO2: 100% 98%  99%    Intake/Output Summary (Last 24 hours) at 08/08/12 1311 Last data filed at 08/08/12 1128  Gross per 24 hour  Intake    170 ml  Output   1075 ml  Net   -905 ml   Filed Weights   08/05/12 0238  Weight:  59.875 kg (132 lb)    Exam:   General:  [name], No acute distress  HEENT:  NCAT, MMM  Cardiovascular:  RRR, nl S1, S2 no mrg, 2+ pulses, warm extremities  Respiratory:  .  No wheezes, rales, or rhonchi.  No increased WOB while on nasal canula.   Abdomen:   NABS, soft, NT/ND  MSK:   Normal tone and bulk-no edema  Neuro:  Grossly intact  Data Reviewed: Basic Metabolic Panel:  Recent Labs Lab 08/04/12 2349 08/05/12 0130 08/05/12 0515 08/06/12 0751 08/07/12 0335 08/08/12 0453  NA 131*  --  134* 134* 132* 133*  K 5.7* 3.8 3.7 3.8 3.9 3.8  CL 92*  --  95* 96 94* 94*  CO2 28  --  28 30 30  32  GLUCOSE 129*  --  109* 94 155* 147*  BUN 14  --  11 9 9 17   CREATININE 0.54  --  0.63 0.56 0.52 0.63  CALCIUM 8.8  --  8.6 8.3* 8.7 8.7  MG  --   --   --   --  2.2  --   PHOS  --   --   --   --  4.0  --    Liver Function Tests:  Recent Labs Lab 08/05/12 0515  AST 8  ALT <5  ALKPHOS 91  BILITOT 0.3  PROT 6.8  ALBUMIN 1.8*   No results found for this basename: LIPASE, AMYLASE,  in the last 168 hours No results found for this basename: AMMONIA,  in the last 168 hours CBC:  Recent Labs Lab 08/04/12 2349 08/05/12 0515 08/06/12 0751 08/07/12 0335 08/08/12 0453  WBC 12.9* 12.3* 12.5* 12.4* 9.8  NEUTROABS  --  9.4*  --   --   --   HGB 8.7* 8.7* 8.0* 8.3* 7.6*  HCT 29.1* 29.2* 26.3* 27.4* 25.6*  MCV 77.4* 78.5 78.5 78.1 78.3  PLT 587* 646* 570* 626* 648*   Cardiac Enzymes:  Recent Labs Lab 08/05/12 0130  TROPONINI <0.30   BNP (last 3 results)  Recent Labs  08/04/12 2349  PROBNP 377.7*   CBG: No results found for this basename: GLUCAP,  in the last 168 hours  Recent Results (from the past 240 hour(s))  CULTURE, BLOOD (ROUTINE X 2)     Status: None   Collection Time    08/05/12  1:20 AM      Result Value Range Status   Specimen Description BLOOD RIGHT WRIST   Final   Special Requests BOTTLES DRAWN AEROBIC AND ANAEROBIC 5CC   Final   Culture   Setup Time 08/05/2012 08:41   Final   Culture     Final   Value:        BLOOD CULTURE RECEIVED NO GROWTH TO DATE CULTURE WILL BE HELD FOR 5 DAYS BEFORE ISSUING A FINAL NEGATIVE REPORT   Report Status PENDING   Incomplete  CULTURE, BLOOD (ROUTINE X 2)  Status: None   Collection Time    08/05/12  1:30 AM      Result Value Range Status   Specimen Description BLOOD LEFT HAND   Final   Special Requests BOTTLES DRAWN AEROBIC AND ANAEROBIC 5CC   Final   Culture  Setup Time 08/05/2012 08:41   Final   Culture     Final   Value:        BLOOD CULTURE RECEIVED NO GROWTH TO DATE CULTURE WILL BE HELD FOR 5 DAYS BEFORE ISSUING A FINAL NEGATIVE REPORT   Report Status PENDING   Incomplete  URINE CULTURE     Status: None   Collection Time    08/05/12  2:14 PM      Result Value Range Status   Specimen Description URINE, CLEAN CATCH   Final   Special Requests NONE   Final   Culture  Setup Time 08/05/2012 21:17   Final   Colony Count NO GROWTH   Final   Culture NO GROWTH   Final   Report Status 08/06/2012 FINAL   Final  CULTURE, EXPECTORATED SPUTUM-ASSESSMENT     Status: None   Collection Time    08/05/12 10:56 PM      Result Value Range Status   Specimen Description SPUTUM   Final   Special Requests Normal   Final   Sputum evaluation     Final   Value: THIS SPECIMEN IS ACCEPTABLE. RESPIRATORY CULTURE REPORT TO FOLLOW.   Report Status 08/05/2012 FINAL   Final  CULTURE, RESPIRATORY (NON-EXPECTORATED)     Status: None   Collection Time    08/05/12 10:56 PM      Result Value Range Status   Specimen Description SPUTUM   Final   Special Requests NONE   Final   Gram Stain     Final   Value: RARE WBC PRESENT, PREDOMINANTLY PMN     RARE SQUAMOUS EPITHELIAL CELLS PRESENT     RARE GRAM POSITIVE RODS     RARE GRAM POSITIVE COCCI     IN PAIRS   Culture NORMAL OROPHARYNGEAL FLORA   Final   Report Status 08/08/2012 FINAL   Final  CULTURE, RESPIRATORY (NON-EXPECTORATED)     Status: None   Collection  Time    08/06/12 10:26 AM      Result Value Range Status   Specimen Description BRONCHIAL WASHINGS   Final   Special Requests Normal   Final   Gram Stain     Final   Value: FEW WBC PRESENT,BOTH PMN AND MONONUCLEAR     NO SQUAMOUS EPITHELIAL CELLS SEEN     RARE GRAM POSITIVE COCCI     IN PAIRS IN CHAINS   Culture Non-Pathogenic Oropharyngeal-type Flora Isolated.   Final   Report Status 08/08/2012 FINAL   Final  MRSA PCR SCREENING     Status: Abnormal   Collection Time    08/06/12  1:29 PM      Result Value Range Status   MRSA by PCR POSITIVE (*) NEGATIVE Final   Comment:            The GeneXpert MRSA Assay (FDA     approved for NASAL specimens     only), is one component of a     comprehensive MRSA colonization     surveillance program. It is not     intended to diagnose MRSA     infection nor to guide or     monitor treatment for  MRSA infections.     RESULT CALLED TO, READ BACK BY AND VERIFIED WITH:     CHEEK, K ON [id number] AT 1625 BY INGLEE     Studies: Dg Chest 2 View  08/08/2012  **ADDENDUM** CREATED: 08/08/2012 09:07:22  Findings discussed with Dr. Mahala Menghini  08/08/2012 at 9:05.  **END ADDENDUM** SIGNED BY: Genevive Bi, M.D.   08/08/2012  *RADIOLOGY REPORT*  Clinical Data: Follow-up left lung mass  CHEST - 2 VIEW  Comparison: Chest radiograph 08/07/2012  Findings: Stable enlarged cardiac silhouette. There is a large rounded left lower lobe mass like density again demonstrated. There is a small locule of gas within this masslike fluid collection seen on lateral projection.  There is right basilar atelectasis which is similar.  There is some improvement air space disease seen on prior.  There is a bullous change at the right lung apex which is confirmed on comparison CT.  On CT exam would include empyema and pulmonary abscess in the differential.  IMPRESSION:  1.  Persistent large left  pleural abscess versus empyema versus mass.  2.  Improvement in right lower lobe air space  disease.   Original Report Authenticated By: Genevive Bi, M.D.    Dg Chest Port 1 View  08/07/2012  *RADIOLOGY REPORT*  Clinical Data: Follow-up effusions.  PORTABLE CHEST - 1 VIEW  Comparison: 08/04/2012.  CT chest 08/05/2012.  The  Findings: Persistent opacity in the left lung base appears stable. There is interval increase of airspace and interstitial disease throughout both lungs suggesting developing pneumonia or edema. Heart size is increased.  No pneumothorax.  IMPRESSION: Stable appearance of large mass-like opacity in the left lung base. Increasing airspace and interstitial disease throughout both lungs suggesting developing edema or pneumonia.   Original Report Authenticated By: Burman Nieves, M.D.     Scheduled Meds: . albuterol  2.5 mg Nebulization Q6H  . Chlorhexidine Gluconate Cloth  6 each Topical Q0600  . enoxaparin (LOVENOX) injection  40 mg Subcutaneous Q24H  . feeding supplement  237 mL Oral TID WC  . ipratropium  0.5 mg Nebulization Q6H  . iron polysaccharides  150 mg Oral BID  . multivitamin with minerals  1 tablet Oral Daily  . mupirocin ointment  1 application Nasal BID  . pantoprazole  40 mg Oral Daily  . piperacillin-tazobactam (ZOSYN)  IV  3.375 g Intravenous Q8H  . [START ON 08/09/2012] predniSONE  30 mg Oral Q breakfast  . senna-docusate  1 tablet Oral BID   Continuous Infusions:   Active Problems:   Leg edema   Hyponatremia   Anemia   HAP (hospital-acquired pneumonia)   Multiple sclerosis   Hemoptysis   Cavitating mass in left lower lung lobe    Time spent: 15 min    Mahala Menghini [facility]  [facility] Pager [phone].  If 7PM-7AM, please contact night-coverage at [email or web address], password [facility] 08/08/2012, 1:11 PM  LOS: 4 days

## 2012-08-09 NOTE — Progress Notes (Signed)
[facility] PROGRESS NOTE  Devon Glenn JYN:[id number] DOB: October 14, 1955 DOA: 08/04/2012 PCP: No primary provider on file.  [name] is a 57 yo M with MS who presents with cough and a 50-lb weight loss.  CT chest demonstrated a large left lung mass and PNA.  He was seen by PCCM and will underwent bronchoscopy on 4/2-he unfortunately desaturated to the 80's and had copious secretions and resp distress, however Bronchoscopic brushings were taken.  Nursing reports he cough's while he eats HE became much less Hypoxic and seemd a lot more stable over period from 42-4.4 and his resoiartory status imporved.  He also was found to have no specific indicators that this was malignancy on his brocnhial brushings, and it was noted on rpt Xray his mass in the lung muight be an abcess moreso than malgnancy.  Assessment/Plan  Acute hypoxic respiratory failure with SOB:  Likely a combination of enlarging mass and/or pneumonia. See below-Continue broad spectrum Abx CT neg for PE.  Health care associated pneumonia (hospital-acquired pneumonia) vs. Post-obstructive pneumonia -  Vancomycin and zosyn with azithromycin 250 daily narrowed to Zosyn onotherapy 4.4.14 -  urine legionella and s. pneumo ag negative -  Sputum culture showing only non-pathogenic flora -  No-growth on blood cultures 4.1.14 so far - SLP evaluated 4.3-no overt signs of aspiration noted -Appreciate pulmonary input into case and further follow-up -Will repeat chest x-ray 08/12/2038  Lung mass, large and without significant mediastinal/hilar LAD--More likely Empyema/Abcess .   - Pulmonary consulted -preliminary washings/biopsy from Bronchoscopy 4.2.14 results show only inflammation -Unable to get much given a mass that was noted and patient de-sat to 80's with attempt at bronchoscopy.    Leg edema, severe, likely due to hypoalbuminemia 2/2  -  Nutrition consult when starting diet again -  Dietary supplements  -  ECHO 4.1  =Ef60-65% with ? Wall motion anomaly -  UA for proteinuria showed microalbuminuria -  Lower extremity duplex 4.1 showed ruptured baker's cyst -  Elevate lower extremities -  Add SCDs in the mean time  Hyponatremia - ?SIADH from his pulmonary issues -  ECHO wln LFTs wnl TSH not done, but as resolving, wouldn't persue furhter -  Gentle hydration -  Serum osms 278 [low], Uosm 802-probably 2/2 to potomania with low solute intake  Leukocytosis:  resolving -steroids started 4.2 so might also be a cause -  Trend  Microcytic anemia - anemia panel- shows predominant iron deficiency -would transfuse blood if less than 7.0-rpt in am -  Hemoccult stool if further drop below 7  Thrombocytosis:  May be due to malignancy or pneumonia.  Trending up -  Trend  Multiple sclerosis - patient unable to provide much history regarding this. -  Unclear history of this and unlikely at his age  Constitutional Hypotension -blood pressures drop to the 90's systolic on sleeping  Diet: regular diet Access:  PIV IVF:  OFF Proph:  lovenox to start post-procedure, SCDs now  Code Status: full code Family Communication: spoke with patient-son not available-called him on Cell phone [phone] with no answer. Left VM Disposition Plan: pending medically stability-needs Rpt CXR and will need PT/OT evaluation prior to d/c home   Consultants:  pulmonary  Procedures:  CTa chest  Bronchoscopy 08/06/12  CXR's  Antibiotics:  Vancomycin 4/1 >>4.4  Zosyn 4/1 >>  Azithro 4/1 >> 4.4  HPI/Subjective: Patient looks much better. deneis cough/cold/f/n/v Has not had stool but thinks he might need to pass one today.  Not been UP  and walking yet.   Objective: Filed Vitals:   08/08/12 2147 08/09/12 0206 08/09/12 0509 08/09/12 1036  BP: 108/65 105/66 106/66 89/59  Pulse: 89 80 80 88  Temp: 98.5 F (36.9 C) 98.5 F (36.9 C) 98.4 F (36.9 C) 97.5 F (36.4 C)  TempSrc: Oral Oral Oral Oral  Resp: 18 16  16 20   Height:      Weight:      SpO2: 100% 99% 98% 98%    Intake/Output Summary (Last 24 hours) at 08/09/12 1353 Last data filed at 08/09/12 1031  Gross per 24 hour  Intake    360 ml  Output   1425 ml  Net  -1065 ml   Filed Weights   08/05/12 0238  Weight: 59.875 kg (132 lb)    Exam:   General:  [name], No acute distress  HEENT:  NCAT, MMM  Cardiovascular:  RRR, nl S1, S2 no mrg, 2+ pulses, warm extremities  Respiratory:  .  No wheezes, rales, or rhonchi.  No increased WOB while on nasal canula.   Abdomen:   NABS, soft, NT/ND  MSK:   no edema  Neuro:  Grossly intact  Data Reviewed: Basic Metabolic Panel:  Recent Labs Lab 08/04/12 2349 08/05/12 0130 08/05/12 0515 08/06/12 0751 08/07/12 0335 08/08/12 0453  NA 131*  --  134* 134* 132* 133*  K 5.7* 3.8 3.7 3.8 3.9 3.8  CL 92*  --  95* 96 94* 94*  CO2 28  --  28 30 30  32  GLUCOSE 129*  --  109* 94 155* 147*  BUN 14  --  11 9 9 17   CREATININE 0.54  --  0.63 0.56 0.52 0.63  CALCIUM 8.8  --  8.6 8.3* 8.7 8.7  MG  --   --   --   --  2.2  --   PHOS  --   --   --   --  4.0  --    Liver Function Tests:  Recent Labs Lab 08/05/12 0515  AST 8  ALT <5  ALKPHOS 91  BILITOT 0.3  PROT 6.8  ALBUMIN 1.8*   No results found for this basename: LIPASE, AMYLASE,  in the last 168 hours No results found for this basename: AMMONIA,  in the last 168 hours CBC:  Recent Labs Lab 08/04/12 2349 08/05/12 0515 08/06/12 0751 08/07/12 0335 08/08/12 0453  WBC 12.9* 12.3* 12.5* 12.4* 9.8  NEUTROABS  --  9.4*  --   --   --   HGB 8.7* 8.7* 8.0* 8.3* 7.6*  HCT 29.1* 29.2* 26.3* 27.4* 25.6*  MCV 77.4* 78.5 78.5 78.1 78.3  PLT 587* 646* 570* 626* 648*   Cardiac Enzymes:  Recent Labs Lab 08/05/12 0130  TROPONINI <0.30   BNP (last 3 results)  Recent Labs  08/04/12 2349  PROBNP 377.7*   CBG: No results found for this basename: GLUCAP,  in the last 168 hours  Recent Results (from the past 240 hour(s))   CULTURE, BLOOD (ROUTINE X 2)     Status: None   Collection Time    08/05/12  1:20 AM      Result Value Range Status   Specimen Description BLOOD RIGHT WRIST   Final   Special Requests BOTTLES DRAWN AEROBIC AND ANAEROBIC 5CC   Final   Culture  Setup Time 08/05/2012 08:41   Final   Culture     Final   Value:        BLOOD CULTURE RECEIVED  NO GROWTH TO DATE CULTURE WILL BE HELD FOR 5 DAYS BEFORE ISSUING A FINAL NEGATIVE REPORT   Report Status PENDING   Incomplete  CULTURE, BLOOD (ROUTINE X 2)     Status: None   Collection Time    08/05/12  1:30 AM      Result Value Range Status   Specimen Description BLOOD LEFT HAND   Final   Special Requests BOTTLES DRAWN AEROBIC AND ANAEROBIC 5CC   Final   Culture  Setup Time 08/05/2012 08:41   Final   Culture     Final   Value:        BLOOD CULTURE RECEIVED NO GROWTH TO DATE CULTURE WILL BE HELD FOR 5 DAYS BEFORE ISSUING A FINAL NEGATIVE REPORT   Report Status PENDING   Incomplete  URINE CULTURE     Status: None   Collection Time    08/05/12  2:14 PM      Result Value Range Status   Specimen Description URINE, CLEAN CATCH   Final   Special Requests NONE   Final   Culture  Setup Time 08/05/2012 21:17   Final   Colony Count NO GROWTH   Final   Culture NO GROWTH   Final   Report Status 08/06/2012 FINAL   Final  CULTURE, EXPECTORATED SPUTUM-ASSESSMENT     Status: None   Collection Time    08/05/12 10:56 PM      Result Value Range Status   Specimen Description SPUTUM   Final   Special Requests Normal   Final   Sputum evaluation     Final   Value: THIS SPECIMEN IS ACCEPTABLE. RESPIRATORY CULTURE REPORT TO FOLLOW.   Report Status 08/05/2012 FINAL   Final  CULTURE, RESPIRATORY (NON-EXPECTORATED)     Status: None   Collection Time    08/05/12 10:56 PM      Result Value Range Status   Specimen Description SPUTUM   Final   Special Requests NONE   Final   Gram Stain     Final   Value: RARE WBC PRESENT, PREDOMINANTLY PMN     RARE SQUAMOUS  EPITHELIAL CELLS PRESENT     RARE GRAM POSITIVE RODS     RARE GRAM POSITIVE COCCI     IN PAIRS   Culture NORMAL OROPHARYNGEAL FLORA   Final   Report Status 08/08/2012 FINAL   Final  CULTURE, RESPIRATORY (NON-EXPECTORATED)     Status: None   Collection Time    08/06/12 10:26 AM      Result Value Range Status   Specimen Description BRONCHIAL WASHINGS   Final   Special Requests Normal   Final   Gram Stain     Final   Value: FEW WBC PRESENT,BOTH PMN AND MONONUCLEAR     NO SQUAMOUS EPITHELIAL CELLS SEEN     RARE GRAM POSITIVE COCCI     IN PAIRS IN CHAINS   Culture Non-Pathogenic Oropharyngeal-type Flora Isolated.   Final   Report Status 08/08/2012 FINAL   Final  MRSA PCR SCREENING     Status: Abnormal   Collection Time    08/06/12  1:29 PM      Result Value Range Status   MRSA by PCR POSITIVE (*) NEGATIVE Final   Comment:            The GeneXpert MRSA Assay (FDA     approved for NASAL specimens     only), is one component of a     comprehensive MRSA colonization  surveillance program. It is not     intended to diagnose MRSA     infection nor to guide or     monitor treatment for     MRSA infections.     RESULT CALLED TO, READ BACK BY AND VERIFIED WITH:     CHEEK, K ON [id number] AT 1625 BY INGLEE     Studies: Dg Chest 2 View  08/08/2012  **ADDENDUM** CREATED: 08/08/2012 09:07:22  Findings discussed with Dr. Mahala Menghini  08/08/2012 at 9:05.  **END ADDENDUM** SIGNED BY: Genevive Bi, M.D.   08/08/2012  *RADIOLOGY REPORT*  Clinical Data: Follow-up left lung mass  CHEST - 2 VIEW  Comparison: Chest radiograph 08/07/2012  Findings: Stable enlarged cardiac silhouette. There is a large rounded left lower lobe mass like density again demonstrated. There is a small locule of gas within this masslike fluid collection seen on lateral projection.  There is right basilar atelectasis which is similar.  There is some improvement air space disease seen on prior.  There is a bullous change at the right  lung apex which is confirmed on comparison CT.  On CT exam would include empyema and pulmonary abscess in the differential.  IMPRESSION:  1.  Persistent large left  pleural abscess versus empyema versus mass.  2.  Improvement in right lower lobe air space disease.   Original Report Authenticated By: Genevive Bi, M.D.     Scheduled Meds: . Chlorhexidine Gluconate Cloth  6 each Topical [id number]  . enoxaparin (LOVENOX) injection  40 mg Subcutaneous Q24H  . feeding supplement  237 mL Oral TID WC  . iron polysaccharides  150 mg Oral BID  . multivitamin with minerals  1 tablet Oral Daily  . mupirocin ointment  1 application Nasal BID  . pantoprazole  40 mg Oral Daily  . piperacillin-tazobactam (ZOSYN)  IV  3.375 g Intravenous Q8H  . predniSONE  30 mg Oral Q breakfast  . senna-docusate  1 tablet Oral BID   Continuous Infusions:   Active Problems:   Leg edema   Hyponatremia   Anemia   HAP (hospital-acquired pneumonia)   Multiple sclerosis   Hemoptysis   Cavitating mass in left lower lung lobe    Time spent: 15 min    Mahala Menghini [facility]  [facility] Pager [phone].  If 7PM-7AM, please contact night-coverage at [email or web address], password [employer] 08/09/2012, 1:53 PM  LOS: 5 days

## 2012-08-09 NOTE — Evaluation (Signed)
Physical Therapy Evaluation Patient Details Name: Devon Glenn MRN: [id number] DOB: June 19, 1955 Today's Date: 08/09/2012 Time: 3875-6433 PT Time Calculation (min): 34 min  PT Assessment / Plan / Recommendation Clinical Impression  Pt with pna and MS presents with great comprise in stength and movment patterns with decreased ability with all mobility. To benefit from further PT to help progress pt with mobility.     PT Assessment  Patient needs continued PT services    Follow Up Recommendations  SNF    Does the patient have the potential to tolerate intense rehabilitation      Barriers to Discharge        Equipment Recommendations  Rolling walker with 5" wheels;Wheelchair (measurements PT)    Recommendations for Other Services     Frequency Min 3X/week    Precautions / Restrictions Restrictions Weight Bearing Restrictions: No   Pertinent Vitals/Pain No reports of pain.       Mobility  Bed Mobility Bed Mobility: Sit to Supine Sit to Supine: 4: Min assist Details for Bed Mobility Assistance: assistance for LE s up on the bed  Transfers Transfers: Sit to Stand;Stand to Sit Sit to Stand: 4: Min assist;With upper extremity assist;From chair/3-in-1 Stand to Sit: 4: Min assist;With upper extremity assist;To bed Details for Transfer Assistance: pt very realiant on tranfers using BUEs as he turned to sit.  Never satning free without UE support due to ataxic nature Ambulation/Gait Ambulation/Gait Assistance: 3: Mod assist Ambulation Distance (Feet): 5 Feet Assistive device: None;Other (Comment) (using B ue suport on furniture/bed) General Gait Details: very ataxic with UES and LEs with walking.  unable to proceed any further today , walked alongside the bed and back to sit with minA , however using UEs greatly for support . Would like to try a RW , but will need two person assist for ataxic movemnt patterns are unpredictable.     Exercises     PT Diagnosis: Difficulty  walking;Generalized weakness  PT Problem List: Decreased strength;Decreased activity tolerance;Decreased balance;Decreased mobility;Decreased coordination;Decreased knowledge of use of DME;Decreased safety awareness PT Treatment Interventions: DME instruction;Gait training;Functional mobility training;Therapeutic activities;Therapeutic exercise;Balance training;Neuromuscular re-education;Patient/family education   PT Goals Acute Rehab PT Goals PT Goal Formulation: With patient Time For Goal Achievement: 08/22/12 Potential to Achieve Goals: Good Pt will go Sit to Supine/Side: with min assist PT Goal: Sit to Supine/Side - Progress: Goal set today Pt will go Sit to Stand: with min assist PT Goal: Sit to Stand - Progress: Goal set today Pt will go Stand to Sit: with min assist PT Goal: Stand to Sit - Progress: Goal set today Pt will Ambulate: 16 - 50 feet;with min assist;with rolling walker PT Goal: Ambulate - Progress: Goal set today  Visit Information  Last PT Received On: 08/09/12 Assistance Needed: +2    Subjective Data  Subjective: Yes I will try. My leg swelling has gone down a lot since yesterday.    Prior Functioning  Home Living Lives With: Family (son) Available Help at Discharge: Family Type of Home: House (moved here in Jan to son's home) Home Access: Stairs to enter Secretary/administrator of Steps: 2 Entrance Stairs-Rails: None Home Adaptive Equipment: Straight cane (otherwise unclear) Prior Function Level of Independence: Independent with assistive device(s) (uses cane) Able to Take Stairs?: Yes Comments: a little unclear on how he was really functioning at his son's home Communication Communication: Expressive difficulties (slow and intentional)    Cognition  Cognition Overall Cognitive Status: Appears within functional limits for tasks assessed/performed  Extremity/Trunk Assessment Right Lower Extremity Assessment RLE ROM/Strength/Tone: Deficits RLE  ROM/Strength/Tone Deficits: overall 4-/5 RLE Coordination: Deficits RLE Coordination Deficits: very ataxic  Left Lower Extremity Assessment LLE ROM/Strength/Tone: Deficits LLE ROM/Strength/Tone Deficits: overall 4-/5 LLE Coordination: Deficits (very ataxic)   Balance    End of Session PT - End of Session Equipment Utilized During Treatment: Gait belt Activity Tolerance: Patient tolerated treatment well Patient left: in bed;with nursing in room;with bed alarm set;with call bell/phone within reach Nurse Communication: Mobility status  GP     Marella Bile 08/09/2012, 3:20 PM Marella Bile, PT Pager: [phone] 08/09/2012

## 2012-08-09 NOTE — Evaluation (Signed)
Occupational Therapy Evaluation Patient Details Name: Devon Glenn MRN: [id number] DOB: [birth year] Today's Date: 08/09/2012 Time: 5784-6962 OT Time Calculation (min): 15 min  OT Assessment / Plan / Recommendation Clinical Impression  Pt admitted with LE edema, cough, weight loss, and PNA. Per notes, pt also has a dx of MS.  Pt presents with significant ataxia and weakness, slightly greater in his L UE, impeding his independence and safety with mobility and ADL.  Pt's family cannot provide the necessary 24 hour care.  Will need SNF for rehab.    OT Assessment  All further OT needs can be met in the next venue of care    Follow Up Recommendations  SNF    Barriers to Discharge      Equipment Recommendations       Recommendations for Other Services    Frequency       Precautions / Restrictions Precautions Precautions: Fall Restrictions Weight Bearing Restrictions: No   Pertinent Vitals/Pain Denies pain    ADL  Eating/Feeding: Minimal assistance (to open packages, cut food) Where Assessed - Eating/Feeding: Edge of bed Grooming: Wash/dry hands;Wash/dry face;Set up Where Assessed - Grooming: Unsupported sitting Upper Body Bathing: Minimal assistance Where Assessed - Upper Body Bathing: Unsupported sitting Lower Body Bathing: Minimal assistance Where Assessed - Lower Body Bathing: Unsupported sitting;Supported sit to stand Upper Body Dressing: Minimal assistance Where Assessed - Upper Body Dressing: Unsupported sitting Lower Body Dressing: Minimal assistance Where Assessed - Lower Body Dressing: Unsupported sitting;Supported sit to stand Toilet Transfer: Minimal assistance (only performed stand pivot) Toilet Transfer Method: Stand pivot Toilet Transfer Equipment:  (recliner) Transfers/Ambulation Related to ADLs: transferred only in absence of second person, pt very ataxic ADL Comments: Pt performed remarkably well, but needs very close assistance due to decreased balance  and ataxic movements even through trunk.    OT Diagnosis: Generalized weakness;Ataxia  OT Problem List: Decreased strength;Decreased activity tolerance;Impaired balance (sitting and/or standing);Decreased knowledge of use of DME or AE;Impaired UE functional use;Impaired tone OT Treatment Interventions:     OT Goals    Visit Information  Last OT Received On: 08/09/12 Assistance Needed: +2    Subjective Data  Subjective: "I bathe and dress myself." Patient Stated Goal: Pt wants home with son, son wants SNF because he cannot provide 24 hour care.   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: Expressive difficulties Dominant Hand: Right         Vision/Perception Vision - History Baseline Vision: Wears glasses only for reading Patient Visual Report: No change from baseline   Cognition  Cognition Overall Cognitive Status: Appears within functional limits for tasks assessed/performed Arousal/Alertness: Awake/alert Orientation Level: Disoriented to;Situation (reason for admission) Behavior During Session: [facility] for tasks performed Cognition - Other Comments: pt used calendar on wall     Extremity/Trunk Assessment Right Upper Extremity Assessment RUE ROM/Strength/Tone: Within functional levels RUE Coordination: Deficits Left Upper Extremity Assessment LUE ROM/Strength/Tone: Deficits LUE ROM/Strength/Tone Deficits: shoulder 4/5, elbow extension 4/5, moderate  ataxia LUE Coordination: Deficits LUE Coordination Deficits: impaired finger to nose and RAM  Trunk Assessment Trunk  Assessment: Other exceptions (truncal ataxia)     Mobility Bed Mobility Bed Mobility: Supine to Sit;Sit to Supine Supine to Sit: 4: Min guard;HOB elevated;With rails Sit to Supine: 4: Min assist Details for Bed Mobility Assistance: assistance for LE s up on the bed  Transfers Transfers: Sit to Stand;Stand to Sit Sit to Stand: 4: Min assist;With upper extremity assist;From bed;From chair/3-in-1 Stand to Sit: 4: Min assist;With upper extremity assist;To bed;To chair/3-in-1 Details for Transfer Assistance: pt very realiant on tranfers using BUEs as he turned to sit.  Never satning free without UE support due to ataxic nature     Exercise     Balance Balance Balance Assessed: Yes Dynamic Sitting Balance Dynamic Sitting - Balance Support: Feet supported Dynamic Sitting - Level of Assistance: 4: Min assist   End of Session OT - End of Session Activity Tolerance: Patient tolerated treatment well Patient left: in bed;with call bell/phone within reach Nurse Communication: Mobility status  GO     [name] 08/09/2012, 4:41 PM [phone]

## 2012-08-10 ENCOUNTER — Inpatient Hospital Stay (HOSPITAL_COMMUNITY): Payer: BC Managed Care – PPO

## 2012-08-10 LAB — BASIC METABOLIC PANEL
Chloride: 95 mEq/L — ABNORMAL LOW (ref 96–112)
GFR calc Af Amer: >90 mL/min (ref >90)
GFR calc non Af Amer: >90 mL/min (ref >90)
Potassium: 4 mEq/L (ref 3.5–5.1)
Sodium: 131 mEq/L — ABNORMAL LOW (ref 135–145)

## 2012-08-10 LAB — CBC
Hemoglobin: 8.1 g/dL — ABNORMAL LOW (ref 13.0–17.0)
MCHC: 29.1 g/dL — ABNORMAL LOW (ref 30.0–36.0)
RDW: 18.1 % — ABNORMAL HIGH (ref 11.5–15.5)
WBC: 14.6 10*3/uL — ABNORMAL HIGH (ref 4.0–10.5)

## 2012-08-10 NOTE — Progress Notes (Signed)
Discussed d/c plans with Pt.  Pt stated that he wanted to go home but that he is willing to go to SNF for short-term rehab.  Pt gave CSW permission to send his information to [facility].    Provided Pt with SNF list.  CSW thanked Pt for his time.  LM for Pt's son.  Providence Crosby, LCSWA Clinical Social Work [phone]

## 2012-08-10 NOTE — Progress Notes (Signed)
Clinical Social Work Department CLINICAL SOCIAL WORK PLACEMENT NOTE 08/10/2012  Patient:  Devon Glenn, Devon Glenn  Account Number:  [id number] Admit date:  08/04/2012  Clinical Social Worker:  Doroteo Glassman  Date/time:  08/10/2012 01:00 PM  Clinical Social Work is seeking post-discharge placement for this patient at the following level of care:   SKILLED NURSING   (*CSW will update this form in Epic as items are completed)   08/10/2012  Patient/family provided with [facility] Department of Clinical Social Work's list of facilities offering this level of care within the geographic area requested by the patient (or if unable, by the patient's family).  08/10/2012  Patient/family informed of their freedom to choose among providers that offer the needed level of care, that participate in Medicare, Medicaid or managed care program needed by the patient, have an available bed and are willing to accept the patient.  08/10/2012  Patient/family informed of [facility]' ownership interest in [facility], as well as of the fact that they are under no obligation to receive care at this facility.  PASARR submitted to EDS on 08/10/2012 PASARR number received from EDS on 08/10/2012  FL2 transmitted to all facilities in geographic area requested by pt/family on  08/10/2012 FL2 transmitted to all facilities within larger geographic area on   Patient informed that his/her managed care company has contracts with or will negotiate with  certain facilities, including the following:     Patient/family informed of bed offers received:   Patient chooses bed at  Physician recommends and patient chooses bed at    Patient to be transferred to  on   Patient to be transferred to facility by   The following physician request were entered in Epic:   Additional Comments:  Providence Crosby, Theresia Majors Clinical Social Work [phone]

## 2012-08-10 NOTE — Progress Notes (Signed)
[facility] PROGRESS NOTE  Devon Glenn JXB:[id number] DOB: 10-05-1955 DOA: 08/04/2012 PCP: No primary provider on file.  [name] is a 57 yo M with MS who presents with cough and a 50-lb weight loss.  CT chest demonstrated a large left lung mass and PNA.  He was seen by PCCM and will underwent bronchoscopy on 4/2-he unfortunately desaturated to the 80's and had copious secretions and resp distress, however Bronchoscopic brushings were taken.  Nursing reports he cough's while he eats HE became much less Hypoxic and seemd a lot more stable over period from 42-4.4 and his resoiartory status imporved.  He also was found to have no specific indicators that this was malignancy on his brocnhial brushings, and it was noted on rpt Xray his mass in the lung muight be an abcess moreso than malgnancy.  Assessment/Plan  Acute hypoxic respiratory failure with SOB:  Likely a combination of enlarging mass and/or pneumonia. See below-Continue broad spectrum Abx CT neg for PE.  Health care associated pneumonia (hospital-acquired pneumonia)  -  Vancomycin and zosyn with azithromycin 250 daily narrowed to Zosyn monotherapy 4.4.14 -  urine legionella and s. pneumo ag negative -  Sputum culture showing only non-pathogenic flora -  No-growth on blood cultures 4.1.14 so far - SLP evaluated 4.3-no overt signs of aspiration noted -Appreciate pulmonary input into case and further follow-up -Will repeat chest x-ray 08/12/2038 -White count still elevated  Lung mass, large and without significant mediastinal/hilar LAD--More likely Empyema/Abcess .   - Pulmonary consulted -preliminary washings/biopsy from Bronchoscopy 4.2.14 results show only inflammation -Unable to get much given a mass that was noted and patient de-sat to 80's with attempt at bronchoscopy.    Leg edema, severe, likely due to hypoalbuminemia 2/2  -  Nutrition consult when starting diet again -  Dietary supplements  -  ECHO 4.1  =Ef60-65% with ? Wall motion anomaly -  UA for proteinuria showed microalbuminuria -  Lower extremity duplex 4.1 showed ruptured baker's cyst -  Elevate lower extremities -  Add SCDs in the mean time  Hyponatremia - ?SIADH from his pulmonary issues -  ECHO wln LFTs wnl TSH not done, but as resolving, wouldn't persue furhter  -  Gentle hydration -  Serum osms 278 [low], Uosm 802-probably 2/2 to potomania with low solute intake  Leukocytosis:  resolving -steroids started 4.2 so might also be a cause -  Trend  Microcytic anemia - anemia panel- shows predominant iron deficiency -would transfuse blood if less than 7.0-rpt in am -  Hemoccult stool if further drop below 7  Thrombocytosis:  May be due to malignancy or pneumonia.  Trending up -  Trend  Multiple sclerosis - patient unable to provide much history regarding this. -  Unclear history of this and unlikely at his age  Constitutional Hypotension -blood pressures drop to the 90's systolic on sleeping  Diet: regular diet Access:  PIV IVF:  OFF Proph:  lovenox to start post-procedure, SCDs now  Code Status: full code Family Communication: spoke with patient-son and updated evening 4.5.14 Disposition Plan: pending medically stability-needs Rpt CXR and will need PT/OT evaluation prior to d/c home-WIll need SNF   Consultants:  pulmonary  Procedures:  CTa chest  Bronchoscopy 08/06/12  CXR's  Antibiotics:  Vancomycin 4/1 >>4.4  Zosyn 4/1 >>  Azithro 4/1 >> 4.4  HPI/Subjective: Patient looks much better. deneis cough/cold/f/n/v Eating supper  Objective: Filed Vitals:   08/10/12 0239 08/10/12 0516 08/10/12 1026 08/10/12 1431  BP: 98/72 122/81  89/57 97/68  Pulse: 76 80 84 89  Temp: 97.9 F (36.6 C) 98.1 F (36.7 C) 98.2 F (36.8 C) 98.2 F (36.8 C)  TempSrc: Oral Oral Oral Oral  Resp: 20 18 20 20   Height:      Weight:      SpO2: 100% 98% 100% 100%    Intake/Output Summary (Last 24 hours) at 08/10/12  1725 Last data filed at 08/10/12 1433  Gross per 24 hour  Intake    480 ml  Output   1400 ml  Net   -920 ml   Filed Weights   08/05/12 0238  Weight: 59.875 kg (132 lb)    Exam:   General:  [name], No acute distress  HEENT:  NCAT, MMM  Cardiovascular:  RRR, nl S1, S2 no mrg, 2+ pulses, warm extremities  Respiratory:  .  No wheezes, rales, or rhonchi.  No increased WOB while on nasal canula.    Data Reviewed: Basic Metabolic Panel:  Recent Labs Lab 08/05/12 0515 08/06/12 0751 08/07/12 0335 08/08/12 0453 08/10/12 0432  NA 134* 134* 132* 133* 131*  K 3.7 3.8 3.9 3.8 4.0  CL 95* 96 94* 94* 95*  CO2 28 30 30  32 30  GLUCOSE 109* 94 155* 147* 83  BUN 11 9 9 17 20   CREATININE 0.63 0.56 0.52 0.63 0.74  CALCIUM 8.6 8.3* 8.7 8.7 8.8  MG  --   --  2.2  --   --   PHOS  --   --  4.0  --   --    Liver Function Tests:  Recent Labs Lab 08/05/12 0515  AST 8  ALT <5  ALKPHOS 91  BILITOT 0.3  PROT 6.8  ALBUMIN 1.8*   No results found for this basename: LIPASE, AMYLASE,  in the last 168 hours No results found for this basename: AMMONIA,  in the last 168 hours CBC:  Recent Labs Lab 08/04/12 2349 08/05/12 0515 08/06/12 0751 08/07/12 0335 08/08/12 0453 08/10/12 0432  WBC 12.9* 12.3* 12.5* 12.4* 9.8 14.6*  NEUTROABS  --  9.4*  --   --   --   --   HGB 8.7* 8.7* 8.0* 8.3* 7.6* 8.1*  HCT 29.1* 29.2* 26.3* 27.4* 25.6* 27.8*  MCV 77.4* 78.5 78.5 78.1 78.3 79.9  PLT 587* 646* 570* 626* 648* 756*   Cardiac Enzymes:  Recent Labs Lab 08/05/12 0130  TROPONINI <0.30   BNP (last 3 results)  Recent Labs  08/04/12 2349  PROBNP 377.7*   CBG: No results found for this basename: GLUCAP,  in the last 168 hours  Recent Results (from the past 240 hour(s))  CULTURE, BLOOD (ROUTINE X 2)     Status: None   Collection Time    08/05/12  1:20 AM      Result Value Range Status   Specimen Description BLOOD RIGHT WRIST   Final   Special Requests BOTTLES DRAWN  AEROBIC AND ANAEROBIC 5CC   Final   Culture  Setup Time 08/05/2012 08:41   Final   Culture     Final   Value:        BLOOD CULTURE RECEIVED NO GROWTH TO DATE CULTURE WILL BE HELD FOR 5 DAYS BEFORE ISSUING A FINAL NEGATIVE REPORT   Report Status PENDING   Incomplete  CULTURE, BLOOD (ROUTINE X 2)     Status: None   Collection Time    08/05/12  1:30 AM      Result Value Range Status  Specimen Description BLOOD LEFT HAND   Final   Special Requests BOTTLES DRAWN AEROBIC AND ANAEROBIC 5CC   Final   Culture  Setup Time 08/05/2012 08:41   Final   Culture     Final   Value:        BLOOD CULTURE RECEIVED NO GROWTH TO DATE CULTURE WILL BE HELD FOR 5 DAYS BEFORE ISSUING A FINAL NEGATIVE REPORT   Report Status PENDING   Incomplete  URINE CULTURE     Status: None   Collection Time    08/05/12  2:14 PM      Result Value Range Status   Specimen Description URINE, CLEAN CATCH   Final   Special Requests NONE   Final   Culture  Setup Time 08/05/2012 21:17   Final   Colony Count NO GROWTH   Final   Culture NO GROWTH   Final   Report Status 08/06/2012 FINAL   Final  CULTURE, EXPECTORATED SPUTUM-ASSESSMENT     Status: None   Collection Time    08/05/12 10:56 PM      Result Value Range Status   Specimen Description SPUTUM   Final   Special Requests Normal   Final   Sputum evaluation     Final   Value: THIS SPECIMEN IS ACCEPTABLE. RESPIRATORY CULTURE REPORT TO FOLLOW.   Report Status 08/05/2012 FINAL   Final  CULTURE, RESPIRATORY (NON-EXPECTORATED)     Status: None   Collection Time    08/05/12 10:56 PM      Result Value Range Status   Specimen Description SPUTUM   Final   Special Requests NONE   Final   Gram Stain     Final   Value: RARE WBC PRESENT, PREDOMINANTLY PMN     RARE SQUAMOUS EPITHELIAL CELLS PRESENT     RARE GRAM POSITIVE RODS     RARE GRAM POSITIVE COCCI     IN PAIRS   Culture NORMAL OROPHARYNGEAL FLORA   Final   Report Status 08/08/2012 FINAL   Final  CULTURE, RESPIRATORY  (NON-EXPECTORATED)     Status: None   Collection Time    08/06/12 10:26 AM      Result Value Range Status   Specimen Description BRONCHIAL WASHINGS   Final   Special Requests Normal   Final   Gram Stain     Final   Value: FEW WBC PRESENT,BOTH PMN AND MONONUCLEAR     NO SQUAMOUS EPITHELIAL CELLS SEEN     RARE GRAM POSITIVE COCCI     IN PAIRS IN CHAINS   Culture Non-Pathogenic Oropharyngeal-type Flora Isolated.   Final   Report Status 08/08/2012 FINAL   Final  MRSA PCR SCREENING     Status: Abnormal   Collection Time    08/06/12  1:29 PM      Result Value Range Status   MRSA by PCR POSITIVE (*) NEGATIVE Final   Comment:            The GeneXpert MRSA Assay (FDA     approved for NASAL specimens     only), is one component of a     comprehensive MRSA colonization     surveillance program. It is not     intended to diagnose MRSA     infection nor to guide or     monitor treatment for     MRSA infections.     RESULT CALLED TO, READ BACK BY AND VERIFIED WITH:     CHEEK, K ON [id number]  AT 1625 BY INGLEE     Studies: No results found.  Scheduled Meds: . Chlorhexidine Gluconate Cloth  6 each Topical Q0600  . enoxaparin (LOVENOX) injection  40 mg Subcutaneous Q24H  . feeding supplement  237 mL Oral TID WC  . iron polysaccharides  150 mg Oral BID  . multivitamin with minerals  1 tablet Oral Daily  . mupirocin ointment  1 application Nasal BID  . pantoprazole  40 mg Oral Daily  . piperacillin-tazobactam (ZOSYN)  IV  3.375 g Intravenous Q8H  . predniSONE  30 mg Oral Q breakfast  . senna-docusate  1 tablet Oral BID   Continuous Infusions:   Active Problems:   Leg edema   Hyponatremia   Anemia   HAP (hospital-acquired pneumonia)   Multiple sclerosis   Hemoptysis   Cavitating mass in left lower lung lobe    Time spent: 15 min    Mahala Menghini [facility]  [facility] Pager [phone].  If 7PM-7AM, please contact night-coverage at [email or web address], password [facility] 08/10/2012,  5:25 PM  LOS: 6 days

## 2012-08-11 ENCOUNTER — Inpatient Hospital Stay (HOSPITAL_COMMUNITY): Payer: BC Managed Care – PPO

## 2012-08-11 LAB — CULTURE, BLOOD (ROUTINE X 2): Culture: NO GROWTH

## 2012-08-11 LAB — CBC
HCT: 27.1 % — ABNORMAL LOW (ref 39.0–52.0)
Hemoglobin: 7.9 g/dL — ABNORMAL LOW (ref 13.0–17.0)
RBC: 3.38 MIL/uL — ABNORMAL LOW (ref 4.22–5.81)

## 2012-08-11 MED ORDER — PREDNISONE 20 MG PO TABS
20.0000 mg | ORAL_TABLET | Freq: Every day | ORAL | Status: DC
  Administered 2012-08-12: 20 mg via ORAL
  Filled 2012-08-11 (×2): qty 1

## 2012-08-11 NOTE — Progress Notes (Signed)
PULMONARY  / CRITICAL CARE MEDICINE  Name: Devon Glenn MRN: [id number] DOB: May 16, 1955    ADMISSION DATE:  08/04/2012 CONSULTATION DATE:  4-1  REFERRING MD :  [facility]:    CHIEF COMPLAINT:  Cough, weight loss, hempotysis  SIGNIFICANT EVENTS / STUDIES:  4-1 see ct of chest report 4-2 FOB terminated early due to copious secretions and resp distress.     CULTURES: 4-1 bc x 2>>neg 4-1 uc>> negative 4-1 sputum>> oral flora 4-2 bronchial washings >> oral flora  ANTIBIOTICS: 4-1 zoysn>> 4-1 vanco>> 4/04 4-2 zithromax>> 4/04  HISTORY OF PRESENT ILLNESS:   57 yo AAM , life long 1 ppd smoker who quit 1 month ago. He reports 50 lb weight  Loss, cough x 1 months with intermittent scant blood.  He has e=recently been treated for Pna. He denies chest pain, orthopnea. CT of chest reveals a left sided mass. Pulmonary has been asked to evaluate for possible FOB and bx of lung mass.  SUBJECTIVE:  Feels better.  Denies chest pain.  Cough decreased.  VITAL SIGNS: Temp:  [98 F (36.7 C)-98.8 F (37.1 C)] 98.3 F (36.8 C) (04/07 0829) Pulse Rate:  [75-92] 75 (04/07 0829) Resp:  [18-20] 19 (04/07 0829) BP: (97-127)/(52-77) 98/68 mmHg (04/07 0829) SpO2:  [98 %-100 %] 100 % (04/07 0829)  PHYSICAL EXAMINATION: General:  Thin Neuro: Alert HEENT:  No LAN Cardiovascular: regular Lungs:  Decreased in bases left > right Abdomen:  Soft +bs Musculoskeletal:  intact Skin:  +lower ext edema   Recent Labs Lab 08/07/12 0335 08/08/12 0453 08/10/12 0432  NA 132* 133* 131*  K 3.9 3.8 4.0  CL 94* 94* 95*  CO2 30 32 30  BUN 9 17 20   CREATININE 0.52 0.63 0.74  GLUCOSE 155* 147* 83    Recent Labs Lab 08/08/12 0453 08/10/12 0432 08/11/12 0420  HGB 7.6* 8.1* 7.9*  HCT 25.6* 27.8* 27.1*  WBC 9.8 14.6* 18.2*  PLT 648* 756* 705*   Dg Chest 2 View  08/11/2012  *RADIOLOGY REPORT*  Clinical Data: Pneumonia.  Left pleural effusion.  CHEST - 2 VIEW  Comparison: 04/06, 04/04,  and 08/04/2012 and CT scan dated 08/05/2012  Findings: The infiltrates and atelectasis at both lung bases have resolved.  However, the large loculated fluid collection at the left lung base persists.  There is a small air collection in the superior aspect of this fluid collection.  Heart size and vascularity are normal.  Blebs are noted at the lung apices.  IMPRESSION:  1.  Resolution of the infiltrates/atelectasis at the bases. 2.  Persistent large loculated subpulmonic fluid collection at the left lung base.   Original Report Authenticated By: Francene Boyers, M.D.    Dg Chest 2 View  08/10/2012  *RADIOLOGY REPORT*  Clinical Data: Left pleural abscess or empyema  CHEST - 2 VIEW  Comparison: Chest radiograph ofApril 4, 2014; chest CT of August 05, 2012.  Findings: Stable mild cardiomegaly.  Right lung is clear.  Large rounded left lower lobe mass like density is again noted is again noted consistent with a large empyema or abscess as described on prior studies.  Associated pleural effusion is noted as well as probable atelectasis of left lower lobe.  IMPRESSION: No significant change seen involving large rounded opacity in left lower lobe consistent with abscess or empyema as described on prior studies.  Associated pleural effusion and atelectasis is noted in left lower lobe.   Original Report Authenticated By: Lupita Raider.,  M.D.     ASSESSMENT / PLAN:  Lt hilar mass in setting of weight loss, chronic cough, hemoptysis and hx of smoking.  Limited bronchoscopy done 4/02 due to respiratory distress and respiratory secretions with concern for pneumonia and lung abscess.  Cytology from bronchoscopy negative, but still concern for malignancy with possible LLL obstructive lesion. Plan: Continue IV Abx with zosyn Change prednisone to 20 mg daily Continue BD's q2h prn Continue bronchial hygiene F/u CXR intermittently He has been scheduled for repeat bronchoscopy on 4/08 at 2 pm  [facility] Minor ACNP Adolph Pollack  PCCM Pager [phone] till 3 pm If no answer page [phone] 08/11/2012, 11:18 AM  Discussed chest imaging findings with pt.  Explained concern for cancer is still present.  He is agreeable to have repeat bronchoscopy to further assess.  Coralyn Helling, MD [facility] 08/11/2012, 3:36 PM Pager:  [phone] After 3pm call: [phone]

## 2012-08-11 NOTE — Progress Notes (Signed)
Physical Therapy Treatment Patient Details Name: Devon Glenn MRN: [id number] DOB: [birth year] Today's Date: 08/11/2012 Time: 1350-1410 PT Time Calculation (min): 20 min  PT Assessment / Plan / Recommendation Comments on Treatment Session  Pt able to ambulate in hallway today, however does have ataxic movements (UE > LE) when ambulating.      Follow Up Recommendations  SNF     Does the patient have the potential to tolerate intense rehabilitation     Barriers to Discharge        Equipment Recommendations  Rolling walker with 5" wheels;Wheelchair (measurements PT)    Recommendations for Other Services    Frequency Min 3X/week   Plan Discharge plan remains appropriate    Precautions / Restrictions Precautions Precautions: Fall Precaution Comments: fall, pt ataxic (more UE than LE) Restrictions Weight Bearing Restrictions: No   Pertinent Vitals/Pain Pt states no pain during session.     Mobility  Bed Mobility Bed Mobility: Not assessed (Pt was sitting at EOB when PT arrived. ) Transfers Transfers: Sit to Stand;Stand to Sit Sit to Stand: 4: Min assist;With upper extremity assist;From bed Stand to Sit: 4: Min assist;With upper extremity assist;To chair/3-in-1;With armrests Details for Transfer Assistance: Pt somewhat impulsive to stand without therapist being ready with max cues for safety.  When told to stand, however he did use his hands on bed to do so, but required mod cues when sitting to keep RW with him and reach back.  Ambulation/Gait Ambulation/Gait Assistance: 1: +2 Total assist Ambulation/Gait: Patient Percentage: 60% Ambulation Distance (Feet): 100 Feet Assistive device: Rolling walker Ambulation/Gait Assistance Details: Pt able to use RW today, however did note that he has ataxic movements (more so in UEs while holding onto RW than in LEs).  the ataxia did not make him let go of RW but he did require cues for maintaining position inside of RW and to make wider  steps with amb.  Gait Pattern: Step-through pattern;Ataxic;Trunk flexed;Narrow base of support;Decreased stride length    Exercises     PT Diagnosis:    PT Problem List:   PT Treatment Interventions:     PT Goals Acute Rehab PT Goals PT Goal Formulation: With patient Time For Goal Achievement: 08/22/12 Potential to Achieve Goals: Good Pt will go Sit to Stand: with supervision PT Goal: Sit to Stand - Progress: Updated due to goal met Pt will go Stand to Sit: with supervision PT Goal: Stand to Sit - Progress: Updated due to goals met Pt will Ambulate: 16 - 50 feet;with min assist;with rolling walker PT Goal: Ambulate - Progress: Progressing toward goal  Visit Information  Last PT Received On: 08/11/12 Assistance Needed: +2    Subjective Data  Subjective: I'll walk   Cognition  Cognition Overall Cognitive Status: Appears within functional limits for tasks assessed/performed Arousal/Alertness: Awake/alert Orientation Level: Disoriented to;Situation Behavior During Session: [facility] for tasks performed    Balance     End of Session PT - End of Session Equipment Utilized During Treatment: Gait belt Activity Tolerance: Patient tolerated treatment well Patient left: in chair;with call bell/phone within reach;with chair alarm set Nurse Communication: Mobility status   GP     Vista Deck 08/11/2012, 3:29 PM

## 2012-08-11 NOTE — Progress Notes (Signed)
CSW spoke with patient & son, Donnalee Curry (cell#: [phone]) & provided SNF bed offers. Son is leaning towards [facility], wants to check it out first before making a decision. CSW will follow-up in the morning & initiate [employer] authorization.   Clinical Social Work Department CLINICAL SOCIAL WORK PLACEMENT NOTE 08/11/2012  Patient:  QUINCEY, QUESINBERRY  Account Number:  [id number] Admit date:  08/04/2012  Clinical Social Worker:  Doroteo Glassman  Date/time:  08/10/2012 01:00 PM  Clinical Social Work is seeking post-discharge placement for this patient at the following level of care:   SKILLED NURSING   (*CSW will update this form in Epic as items are completed)   08/10/2012  Patient/family provided with [facility] Department of Clinical Social Work's list of facilities offering this level of care within the geographic area requested by the patient (or if unable, by the patient's family).  08/10/2012  Patient/family informed of their freedom to choose among providers that offer the needed level of care, that participate in Medicare, Medicaid or managed care program needed by the patient, have an available bed and are willing to accept the patient.  08/10/2012  Patient/family informed of [facility]' ownership interest in [facility], as well as of the fact that they are under no obligation to receive care at this facility.  PASARR submitted to EDS on 08/10/2012 PASARR number received from [employer] on 08/10/2012  FL2 transmitted to all facilities in geographic area requested by pt/family on  08/10/2012 FL2 transmitted to all facilities within larger geographic area on   Patient informed that his/her managed care company has contracts with or will negotiate with  certain facilities, including the following:     Patient/family informed of bed offers received:  08/11/2012 Patient chooses bed at  Physician recommends and patient chooses bed at     Patient to be transferred to  on   Patient to be transferred to facility by   The following physician request were entered in Epic:   Additional Comments:  Unice Bailey, LCSW [facility] Clinical Social Worker cell #: [phone]

## 2012-08-11 NOTE — Progress Notes (Signed)
NUTRITION FOLLOW UP  Intervention:  Continue Ensure Complete TID Continue Multivitamin with minerals  Recommend liberalizing diet to regular Recommend weighing pt daily or weekly   Nutrition Dx:   Unintended weight loss related to poor appetite as evidenced by pt statement.   Goal:   Pt to meet >/= 90% of their estimated nutrition needs; being met  Monitor:   Wt; no new wt Po intake; 100% Labs  Assessment:   4/1: Pt admitted with coughing for the past 2-3 weeks that has been non-productive. Pt with history of multiple sclerosis. Pt reports 10 pound unintentional weight loss since January 2014 from poor appetite. Pt reports only eating 1-2 meals/day at home and recently added Ensure to his diet last month. Pt denies any problems chewing/swallowing. Pt reports he has been feeling weak. Performed nutrition physical exam.  4/7: Pt reports having a good appetite, eating 100% of 3 meals daily, and drinking 2 or 3 Ensure supplements daily. Per RN notes, pt has been eating 100% of most meals. No new wt on pt since 4/1  Height: Ht Readings from Last 1 Encounters:  08/05/12 6\' 1"  (1.854 m)    Weight Status:   Wt Readings from Last 1 Encounters:  08/05/12 132 lb (59.875 kg)    Re-estimated needs:  Kcal: 1980-2100 Protein: 90-108 grams Fluid: 2.1-2.4L/day  Skin: +1 RLE edema, non-pitting LLE edema; intact  Diet Order: Cardiac   Intake/Output Summary (Last 24 hours) at 08/11/12 1554 Last data filed at 08/11/12 1342  Gross per 24 hour  Intake    760 ml  Output   1350 ml  Net   -590 ml    Last BM: 4/6   Labs:   Recent Labs Lab 08/06/12 0751 08/07/12 0335 08/08/12 0453 08/10/12 0432  NA 134* 132* 133* 131*  K 3.8 3.9 3.8 4.0  CL 96 94* 94* 95*  CO2 30 30 32 30  BUN 9 9 17 20   CREATININE 0.56 0.52 0.63 0.74  CALCIUM 8.3* 8.7 8.7 8.8  MG  --  2.2  --   --   PHOS  --  4.0  --   --   GLUCOSE 94 155* 147* 83    CBG (last 3)  No results found for this basename:  GLUCAP,  in the last 72 hours  Scheduled Meds: . Chlorhexidine Gluconate Cloth  6 each Topical Q0600  . enoxaparin (LOVENOX) injection  40 mg Subcutaneous Q24H  . feeding supplement  237 mL Oral TID WC  . iron polysaccharides  150 mg Oral BID  . multivitamin with minerals  1 tablet Oral Daily  . pantoprazole  40 mg Oral Daily  . piperacillin-tazobactam (ZOSYN)  IV  3.375 g Intravenous Q8H  . [START ON 08/12/2012] predniSONE  20 mg Oral Q breakfast  . senna-docusate  1 tablet Oral BID    Ian Malkin RD, LDN Inpatient Clinical Dietitian Pager: [phone] After Hours Pager: [phone]

## 2012-08-11 NOTE — Progress Notes (Signed)
[facility] PROGRESS NOTE  Devon Glenn ZOX:[id number] DOB: 1956-02-05 DOA: 08/04/2012 PCP: No primary provider on file.  Devon Glenn is a 57 yo M with MS who presents with cough and a 50-lb weight loss.  CT chest demonstrated a large left lung mass and PNA.  He was seen by PCCM and will underwent bronchoscopy on 4/2-he unfortunately desaturated to the 80's and had copious secretions and resp distress, however Bronchoscopic brushings were taken.  Nursing reports he cough's while he eats HE became much less Hypoxic and seemd a lot more stable over period from 42-4.4 and his resoiartory status imporved.  He also was found to have no specific indicators that this was malignancy on his brocnhial brushings, and it was noted on rpt Xray his mass in the lung muight be an abcess moreso than malgnancy.  Assessment/Plan  Acute hypoxic respiratory failure with SOB:  Likely a combination of enlarging mass and/or pneumonia. See below-Continue broad spectrum Abx CT neg for PE.  Health care associated pneumonia (hospital-acquired pneumonia)  -  Vancomycin and zosyn with azithromycin 250 daily narrowed to Zosyn monotherapy 4.4.14 -  urine legionella and s. pneumo ag negative -  Sputum culture showing only non-pathogenic flora -  No-growth on blood cultures 4.1.14 so far - SLP evaluated 4.3-no overt signs of aspiration noted -Appreciate pulmonary input into case and further follow-up -repeat chest x-ray 08/10/2012 still shows a mass present which is unchanged-see below  Lung mass, large and without significant mediastinal/hilar LAD--More likely Empyema/Abcess .   - Pulmonary consulted -preliminary washings/biopsy from Bronchoscopy 4.2.14 results show only inflammation -Unable to get much given a mass that was noted and patient de-sat to 80's with attempt at bronchoscopy.   -Re-attempt at Bronchoscopy scheduled for 08/12/12  Leg edema, severe, likely due to hypoalbuminemia 2/2  - Nutrition consult  when starting diet again - Dietary supplements   - ECHO 4.1 =Ef60-65% with ? Wall motion anomaly - UA for proteinuria showed microalbuminuria - Lower extremity duplex 4.1 showed ruptured baker's cyst - Add SCDs in the mean time  Hyponatremia - ?SIADH from his pulmonary issues -  ECHO wln LFTs wnl TSH not done, but as resolving, wouldn't persue furhter  -  Gentle hydration -  Serum osms 278 [low], Uosm 802-probably 2/2 to potomania with low solute intake  Leukocytosis:  resolving -steroids started 4.2 so might also be a cause -  Trend  Microcytic anemia - anemia panel- shows predominant iron deficiency -would transfuse blood if less than 7.0-rpt in am - Hemoccult stool if further drop below 7  Thrombocytosis:  May be due to malignancy or pneumonia.  Trending up -  Trend  Multiple sclerosis - patient unable to provide much history regarding this. -  Unclear history of this and unlikely at his age  Constitutional Hypotension -blood pressures drop to the 90's systolic on sleeping  Diet: regular diet Access:  PIV IVF:  OFF Proph:  lovenox to start post-procedure, SCDs now  Code Status: full code Family Communication: Called patient's son at [phone] number seems to be disconnected Disposition Plan: pending medically stability-needs Rpt CXR and will need PT/OT evaluation prior to d/c home-WIll need SNF   Consultants:  pulmonary  Procedures:  CTa chest  Bronchoscopy 08/06/12  CXR's  Antibiotics:  Vancomycin 4/1 >>4.4  Zosyn 4/1 >>  Azithro 4/1 >> 4.4  HPI/Subjective: Patient looks much better. No further concerns.  tol diet well.  Objective: Filed Vitals:   08/11/12 0147 08/11/12 0500 08/11/12 6213 08/11/12  1456  BP: 127/52 125/77 98/68 110/69  Pulse: 82 81 75 79  Temp: 98 F (36.7 C) 98.4 F (36.9 C) 98.3 F (36.8 C) 97.9 F (36.6 C)  TempSrc: Oral Oral Oral Oral  Resp: 18 20 19 18   Height:      Weight:      SpO2: 98% 98% 100% 100%     Intake/Output Summary (Last 24 hours) at 08/11/12 1621 Last data filed at 08/11/12 1342  Gross per 24 hour  Intake    760 ml  Output   1350 ml  Net   -590 ml   Filed Weights   08/05/12 0238  Weight: 59.875 kg (132 lb)    Exam:   General:  [name], No acute distress  HEENT:  NCAT, MMM  Cardiovascular:  RRR, nl S1, S2 no mrg, 2+ pulses, warm extremities  Respiratory:  .  No wheezes, rales, or rhonchi.  No increased WOB while on nasal canula.    Data Reviewed: Basic Metabolic Panel:  Recent Labs Lab 08/05/12 0515 08/06/12 0751 08/07/12 0335 08/08/12 0453 08/10/12 0432  NA 134* 134* 132* 133* 131*  K 3.7 3.8 3.9 3.8 4.0  CL 95* 96 94* 94* 95*  CO2 28 30 30  32 30  GLUCOSE 109* 94 155* 147* 83  BUN 11 9 9 17 20   CREATININE 0.63 0.56 0.52 0.63 0.74  CALCIUM 8.6 8.3* 8.7 8.7 8.8  MG  --   --  2.2  --   --   PHOS  --   --  4.0  --   --    Liver Function Tests:  Recent Labs Lab 08/05/12 0515  AST 8  ALT <5  ALKPHOS 91  BILITOT 0.3  PROT 6.8  ALBUMIN 1.8*   No results found for this basename: LIPASE, AMYLASE,  in the last 168 hours No results found for this basename: AMMONIA,  in the last 168 hours CBC:  Recent Labs Lab 08/04/12 2349 08/05/12 0515 08/06/12 0751 08/07/12 0335 08/08/12 0453 08/10/12 0432 08/11/12 0420  WBC 12.9* 12.3* 12.5* 12.4* 9.8 14.6* 18.2*  NEUTROABS  --  9.4*  --   --   --   --   --   HGB 8.7* 8.7* 8.0* 8.3* 7.6* 8.1* 7.9*  HCT 29.1* 29.2* 26.3* 27.4* 25.6* 27.8* 27.1*  MCV 77.4* 78.5 78.5 78.1 78.3 79.9 80.2  PLT 587* 646* 570* 626* 648* 756* 705*   Cardiac Enzymes:  Recent Labs Lab 08/05/12 0130  TROPONINI <0.30   BNP (last 3 results)  Recent Labs  08/04/12 2349  PROBNP 377.7*   CBG: No results found for this basename: GLUCAP,  in the last 168 hours  Recent Results (from the past 240 hour(s))  CULTURE, BLOOD (ROUTINE X 2)     Status: None   Collection Time    08/05/12  1:20 AM      Result Value  Range Status   Specimen Description BLOOD RIGHT WRIST   Final   Special Requests BOTTLES DRAWN AEROBIC AND ANAEROBIC 5CC   Final   Culture  Setup Time 08/05/2012 08:41   Final   Culture NO GROWTH 5 DAYS   Final   Report Status 08/11/2012 FINAL   Final  CULTURE, BLOOD (ROUTINE X 2)     Status: None   Collection Time    08/05/12  1:30 AM      Result Value Range Status   Specimen Description BLOOD LEFT HAND   Final   Special  Requests BOTTLES DRAWN AEROBIC AND ANAEROBIC 5CC   Final   Culture  Setup Time 08/05/2012 08:41   Final   Culture NO GROWTH 5 DAYS   Final   Report Status 08/11/2012 FINAL   Final  URINE CULTURE     Status: None   Collection Time    08/05/12  2:14 PM      Result Value Range Status   Specimen Description URINE, CLEAN CATCH   Final   Special Requests NONE   Final   Culture  Setup Time 08/05/2012 21:17   Final   Colony Count NO GROWTH   Final   Culture NO GROWTH   Final   Report Status 08/06/2012 FINAL   Final  CULTURE, EXPECTORATED SPUTUM-ASSESSMENT     Status: None   Collection Time    08/05/12 10:56 PM      Result Value Range Status   Specimen Description SPUTUM   Final   Special Requests Normal   Final   Sputum evaluation     Final   Value: THIS SPECIMEN IS ACCEPTABLE. RESPIRATORY CULTURE REPORT TO FOLLOW.   Report Status 08/05/2012 FINAL   Final  CULTURE, RESPIRATORY (NON-EXPECTORATED)     Status: None   Collection Time    08/05/12 10:56 PM      Result Value Range Status   Specimen Description SPUTUM   Final   Special Requests NONE   Final   Gram Stain     Final   Value: RARE WBC PRESENT, PREDOMINANTLY PMN     RARE SQUAMOUS EPITHELIAL CELLS PRESENT     RARE GRAM POSITIVE RODS     RARE GRAM POSITIVE COCCI     IN PAIRS   Culture NORMAL OROPHARYNGEAL FLORA   Final   Report Status 08/08/2012 FINAL   Final  CULTURE, RESPIRATORY (NON-EXPECTORATED)     Status: None   Collection Time    08/06/12 10:26 AM      Result Value Range Status   Specimen  Description BRONCHIAL WASHINGS   Final   Special Requests Normal   Final   Gram Stain     Final   Value: FEW WBC PRESENT,BOTH PMN AND MONONUCLEAR     NO SQUAMOUS EPITHELIAL CELLS SEEN     RARE GRAM POSITIVE COCCI     IN PAIRS IN CHAINS   Culture Non-Pathogenic Oropharyngeal-type Flora Isolated.   Final   Report Status 08/08/2012 FINAL   Final  MRSA PCR SCREENING     Status: Abnormal   Collection Time    08/06/12  1:29 PM      Result Value Range Status   MRSA by PCR POSITIVE (*) NEGATIVE Final   Comment:            The GeneXpert MRSA Assay (FDA     approved for NASAL specimens     only), is one component of a     comprehensive MRSA colonization     surveillance program. It is not     intended to diagnose MRSA     infection nor to guide or     monitor treatment for     MRSA infections.     RESULT CALLED TO, READ BACK BY AND VERIFIED WITH:     CHEEK, K ON [id number] AT 1625 BY INGLEE     Studies: Dg Chest 2 View  08/11/2012  *RADIOLOGY REPORT*  Clinical Data: Pneumonia.  Left pleural effusion.  CHEST - 2 VIEW  Comparison: 04/06, 04/04, and 08/04/2012 and  CT scan dated 08/05/2012  Findings: The infiltrates and atelectasis at both lung bases have resolved.  However, the large loculated fluid collection at the left lung base persists.  There is a small air collection in the superior aspect of this fluid collection.  Heart size and vascularity are normal.  Blebs are noted at the lung apices.  IMPRESSION:  1.  Resolution of the infiltrates/atelectasis at the bases. 2.  Persistent large loculated subpulmonic fluid collection at the left lung base.   Original Report Authenticated By: Francene Boyers, M.D.    Dg Chest 2 View  08/10/2012  *RADIOLOGY REPORT*  Clinical Data: Left pleural abscess or empyema  CHEST - 2 VIEW  Comparison: Chest radiograph ofApril 4, 2014; chest CT of August 05, 2012.  Findings: Stable mild cardiomegaly.  Right lung is clear.  Large rounded left lower lobe mass like density is  again noted is again noted consistent with a large empyema or abscess as described on prior studies.  Associated pleural effusion is noted as well as probable atelectasis of left lower lobe.  IMPRESSION: No significant change seen involving large rounded opacity in left lower lobe consistent with abscess or empyema as described on prior studies.  Associated pleural effusion and atelectasis is noted in left lower lobe.   Original Report Authenticated By: Lupita Raider.,  M.D.     Scheduled Meds: . Chlorhexidine Gluconate Cloth  6 each Topical [id number]  . enoxaparin (LOVENOX) injection  40 mg Subcutaneous Q24H  . feeding supplement  237 mL Oral TID WC  . iron polysaccharides  150 mg Oral BID  . multivitamin with minerals  1 tablet Oral Daily  . pantoprazole  40 mg Oral Daily  . piperacillin-tazobactam (ZOSYN)  IV  3.375 g Intravenous Q8H  . [START ON 08/12/2012] predniSONE  20 mg Oral Q breakfast  . senna-docusate  1 tablet Oral BID   Continuous Infusions:   Active Problems:   Leg edema   Hyponatremia   Anemia   HAP (hospital-acquired pneumonia)   Multiple sclerosis   Hemoptysis   Cavitating mass in left lower lung lobe    Time spent: 15 min    Mahala Menghini [facility]  [facility] Pager [phone].  If 7PM-7AM, please contact night-coverage at [email or web address], password [facility] 08/11/2012, 4:21 PM  LOS: 7 days

## 2012-08-12 ENCOUNTER — Encounter (HOSPITAL_COMMUNITY)
Admission: EM | Disposition: A | Discharge status: Skilled Nursing Facility | Payer: Self-pay | Source: Home / Self Care | Attending: Family Medicine

## 2012-08-12 ENCOUNTER — Inpatient Hospital Stay (HOSPITAL_COMMUNITY): Payer: BC Managed Care – PPO

## 2012-08-12 DIAGNOSIS — J984 Other disorders of lung: Secondary | ICD-10-CM

## 2012-08-12 DIAGNOSIS — R042 Hemoptysis: Secondary | ICD-10-CM

## 2012-08-12 HISTORY — PX: VIDEO BRONCHOSCOPY: SHX5072

## 2012-08-12 SURGERY — BRONCHOSCOPY, WITH FLUOROSCOPY
Anesthesia: Moderate Sedation | Laterality: Bilateral

## 2012-08-12 MED ORDER — FENTANYL CITRATE 0.05 MG/ML IJ SOLN
INTRAMUSCULAR | Status: DC | PRN
  Administered 2012-08-12: 50 ug via INTRAVENOUS

## 2012-08-12 MED ORDER — AMOXICILLIN-POT CLAVULANATE ER 1000-62.5 MG PO TB12
2.0000 | ORAL_TABLET | Freq: Two times a day (BID) | ORAL | Status: DC
  Administered 2012-08-12 – 2012-08-14 (×4): 2 via ORAL
  Filled 2012-08-12 (×5): qty 2

## 2012-08-12 MED ORDER — MIDAZOLAM HCL 10 MG/2ML IJ SOLN
INTRAMUSCULAR | Status: DC | PRN
  Administered 2012-08-12 (×3): 2 mg via INTRAVENOUS

## 2012-08-12 MED ORDER — LIDOCAINE HCL 1 % IJ SOLN
INTRAMUSCULAR | Status: DC | PRN
  Administered 2012-08-12: 6 mL via RESPIRATORY_TRACT

## 2012-08-12 NOTE — Procedures (Signed)
Bronchoscopy Procedure Note Rami Budhu [id number] [birth year]  Procedure: Bronchoscopy, BAL, bronchial brushing, and transbronchial biopsy left lower lobe Indications: 57 yo former smoker with weight loss and left lower lung mass  Procedure Details Consent: Risks of procedure as well as the alternatives and risks of each were explained to the patient.  Consent for procedure obtained. Time Out: Verified patient identification, verified procedure, site/side was marked, verified correct patient position, special equipment/implants available, medications/allergies/relevant history reviewed, required imaging and test results available.  Performed  Cetacaine was applied for anesthesia of posterior pharynx.  The patient was given 6 mg versed and 50 mcg fentanyl for sedation and analgesia.  The bronchoscope was entered orally.  Total of 8 ml of 2% lidocaine instilled for topical anesthesia of the airways.  Vocal cords visualized with normal motion.  Bronchoscope then entered into carina.  Mucosa in this region appeared normal.  Bronchoscope entered into right main bronchus.  Right upper, middle, and lower lobe orifices visualized.  Normal mucosa, and no endobronchial lesions.  Bronchoscope entered into left main bronchus.  Left upper, lingular, and lower lobe orifices visualized.  Yellow secretions noted from left lower lobe suctioned easily.  No endobronchial lesions.  Instilled 90 ml of saline into left lower lobe for BAL with 10 ml of blood secretions returned.  Bronchial brushing then done from left lower lobe.  Then using fluoroscopic guidance 6 passes at transbronchial biopsy obtained from left lower lobe.  Of note is that there was obstructing mass blocking full passage of biopsy forceps into left lower lobe.  No obvious bleeding after inspection of airways, and bronchoscope was withdrawn.  There were no immediate complications.  Blood pressure and oxygenation remained stable during  procedure.    Patient transferred to recovery in stable condition.  Plan: Post-procedure chest xray pending Specimens will be sent for cytology and surgical pathology  Coralyn Helling, MD [facility] Pulmonary/Critical Care 08/12/2012, 2:22 PM Pager:  [phone] After 3pm call: [phone]

## 2012-08-12 NOTE — Progress Notes (Signed)
[facility] PROGRESS NOTE  Devon Glenn ZOX:[id number] DOB: 1955/06/01 DOA: 08/04/2012 PCP: No primary provider on file.  [name] is a 57 yo M with MS who presents with cough and a 50-lb weight loss.  CT chest demonstrated a large left lung mass and PNA.  He was seen by PCCM and will underwent bronchoscopy on 4/2-he unfortunately desaturated to the 80's and had copious secretions and resp distress, however Bronchoscopic brushings were taken.  Nursing reports he cough's while he eats He became much less Hypoxic and seemd a lot more stable over period from 42-4.4 and his respiratory status imporved.  He also was found to have no specific indicators that this was malignancy on his brocnhial brushings, and it was noted on rpt Xray his mass in the lung muight be an abcess moreso than malgnancy.  Assessment/Plan  Acute hypoxic respiratory failure with SOB:  Likely a combination of enlarging mass and/or pneumonia. See below-Continue broad spectrum Abx CT neg for PE.  Health care associated pneumonia (hospital-acquired pneumonia)  -  Vancomycin and zosyn with azithromycin 250 daily narrowed to Zosyn monotherapy 4.4.14-this was d/c 4.8.14 as and transitioned to PO Augmentin to complete 14 days therapy ending 08/18/12 -  urine legionella and s. pneumo ag negative -  Sputum culture showing only non-pathogenic flora -  No-growth on blood cultures 4.1.14 so far - SLP evaluated 4.3-no overt signs of aspiration noted -repeat chest x-ray 08/10/2012 still shows a mass present which is unchanged-see below  Lung mass, large and without significant mediastinal/hilar LAD--More likely Empyema/Abcess .   - Pulmonary consulted -preliminary washings/biopsy from Bronchoscopy 4.2.14 results show only inflammation -Unable to get much given a mass that was noted and patient de-sat to 80's with attempt at bronchoscopy.   -Re-attempt at Bronchoscopy done this secretions but a possible mass-labs results should  be available by 08/13/2012 however this should not preclude patient from being discharge to skilled nursing facility -Discussed with Dr. Craige Cotta and he agrees with the same. Patient can be followed up as an outpatient for possible lung carcinoma.  Leg edema, severe, likely due to hypoalbuminemia 2/2  - Nutrition consult when starting diet again - Dietary supplements   - ECHO 4.1 =Ef60-65% with ? Wall motion anomaly - UA for proteinuria showed microalbuminuria - Lower extremity duplex 4.1 showed ruptured baker's cyst - Add SCDs in the mean time  Hyponatremia - ?SIADH from his pulmonary issues-resolving -  ECHO wln LFTs wnl TSH not done, but as resolving, wouldn't persue furhter  -  Gentle hydration -  Serum osms 278 [low], Uosm 802-probably 2/2 to potomania with low solute intake  Leukocytosis:  resolving -steroids started 4.2-thought multifactorial and has resvoled - Trend  Microcytic anemia - anemia panel- shows predominant iron deficiency -would transfuse blood if less than 7.0-rpt in am - Hemoccult stool if further drop below 7 -rpt CBC in am  Thrombocytosis:  May be due to malignancy or pneumonia.  Trending up -  Trend  Multiple sclerosis - patient unable to provide much history regarding this. -  Unclear history of this and unlikely at his age -Neurology input needed as an out-patient to determine what the diagnosis -Work-up seems to have been started with CSF analysis and this was mostly negative [in care everywhere]  Constitutional Hypotension -blood pressures drop to the 90's systolic on sleeping  Diet: regular diet Access:  PIV IVF:  OFF Proph:  lovenox to start post-procedure, SCDs now  Code Status: full code Family Communication: Called patient's  son at [phone]-updated that patient probably has lung Ca and had a test which will help determine this.  Aware work-up for this and neurological symptoms can be worked up as an out-patient Disposition Plan: pending  medically stability-Need SNF-Insurance still to apporve   Consultants:  pulmonary  Procedures:  CTa chest  Bronchoscopy 08/06/12  CXR's  Antibiotics:  Vancomycin 4/1 >>4.4  Zosyn 4/1 >>  Azithro 4/1 >> 4.4  HPI/Subjective: Patient looks much better. No further concerns.  tol diet well.  Objective: Filed Vitals:   08/12/12 1450 08/12/12 1500 08/12/12 1510 08/12/12 1520  BP: 128/84 130/84 117/83 118/73  Pulse:      Temp:      TempSrc:      Resp: 20 21 20  35  Height:      Weight:      SpO2: 90% 96% 96% 92%    Intake/Output Summary (Last 24 hours) at 08/12/12 1637 Last data filed at 08/12/12 4098  Gross per 24 hour  Intake     50 ml  Output    875 ml  Net   -825 ml   Filed Weights   08/05/12 0238  Weight: 59.875 kg (132 lb)    Exam:   General:  [name], No acute distress  HEENT:  NCAT, MMM  Cardiovascular:  RRR, nl S1, S2 no mrg, 2+ pulses, warm extremities  Respiratory:  .  No wheezes, rales, or rhonchi.  No increased WOB while on nasal canula.    Data Reviewed: Basic Metabolic Panel:  Recent Labs Lab 08/06/12 0751 08/07/12 0335 08/08/12 0453 08/10/12 0432  NA 134* 132* 133* 131*  K 3.8 3.9 3.8 4.0  CL 96 94* 94* 95*  CO2 30 30 32 30  GLUCOSE 94 155* 147* 83  BUN 9 9 17 20   CREATININE 0.56 0.52 0.63 0.74  CALCIUM 8.3* 8.7 8.7 8.8  MG  --  2.2  --   --   PHOS  --  4.0  --   --    Liver Function Tests: No results found for this basename: AST, ALT, ALKPHOS, BILITOT, PROT, ALBUMIN,  in the last 168 hours No results found for this basename: LIPASE, AMYLASE,  in the last 168 hours No results found for this basename: AMMONIA,  in the last 168 hours CBC:  Recent Labs Lab 08/06/12 0751 08/07/12 0335 08/08/12 0453 08/10/12 0432 08/11/12 0420  WBC 12.5* 12.4* 9.8 14.6* 18.2*  HGB 8.0* 8.3* 7.6* 8.1* 7.9*  HCT 26.3* 27.4* 25.6* 27.8* 27.1*  MCV 78.5 78.1 78.3 79.9 80.2  PLT 570* 626* 648* 756* 705*   Cardiac Enzymes: No  results found for this basename: CKTOTAL, CKMB, CKMBINDEX, TROPONINI,  in the last 168 hours BNP (last 3 results)  Recent Labs  08/04/12 2349  PROBNP 377.7*   CBG: No results found for this basename: GLUCAP,  in the last 168 hours  Recent Results (from the past 240 hour(s))  CULTURE, BLOOD (ROUTINE X 2)     Status: None   Collection Time    08/05/12  1:20 AM      Result Value Range Status   Specimen Description BLOOD RIGHT WRIST   Final   Special Requests BOTTLES DRAWN AEROBIC AND ANAEROBIC 5CC   Final   Culture  Setup Time 08/05/2012 08:41   Final   Culture NO GROWTH 5 DAYS   Final   Report Status 08/11/2012 FINAL   Final  CULTURE, BLOOD (ROUTINE X 2)     Status:  None   Collection Time    08/05/12  1:30 AM      Result Value Range Status   Specimen Description BLOOD LEFT HAND   Final   Special Requests BOTTLES DRAWN AEROBIC AND ANAEROBIC 5CC   Final   Culture  Setup Time 08/05/2012 08:41   Final   Culture NO GROWTH 5 DAYS   Final   Report Status 08/11/2012 FINAL   Final  URINE CULTURE     Status: None   Collection Time    08/05/12  2:14 PM      Result Value Range Status   Specimen Description URINE, CLEAN CATCH   Final   Special Requests NONE   Final   Culture  Setup Time 08/05/2012 21:17   Final   Colony Count NO GROWTH   Final   Culture NO GROWTH   Final   Report Status 08/06/2012 FINAL   Final  CULTURE, EXPECTORATED SPUTUM-ASSESSMENT     Status: None   Collection Time    08/05/12 10:56 PM      Result Value Range Status   Specimen Description SPUTUM   Final   Special Requests Normal   Final   Sputum evaluation     Final   Value: THIS SPECIMEN IS ACCEPTABLE. RESPIRATORY CULTURE REPORT TO FOLLOW.   Report Status 08/05/2012 FINAL   Final  CULTURE, RESPIRATORY (NON-EXPECTORATED)     Status: None   Collection Time    08/05/12 10:56 PM      Result Value Range Status   Specimen Description SPUTUM   Final   Special Requests NONE   Final   Gram Stain     Final    Value: RARE WBC PRESENT, PREDOMINANTLY PMN     RARE SQUAMOUS EPITHELIAL CELLS PRESENT     RARE GRAM POSITIVE RODS     RARE GRAM POSITIVE COCCI     IN PAIRS   Culture NORMAL OROPHARYNGEAL FLORA   Final   Report Status 08/08/2012 FINAL   Final  CULTURE, RESPIRATORY (NON-EXPECTORATED)     Status: None   Collection Time    08/06/12 10:26 AM      Result Value Range Status   Specimen Description BRONCHIAL WASHINGS   Final   Special Requests Normal   Final   Gram Stain     Final   Value: FEW WBC PRESENT,BOTH PMN AND MONONUCLEAR     NO SQUAMOUS EPITHELIAL CELLS SEEN     RARE GRAM POSITIVE COCCI     IN PAIRS IN CHAINS   Culture Non-Pathogenic Oropharyngeal-type Flora Isolated.   Final   Report Status 08/08/2012 FINAL   Final  MRSA PCR SCREENING     Status: Abnormal   Collection Time    08/06/12  1:29 PM      Result Value Range Status   MRSA by PCR POSITIVE (*) NEGATIVE Final   Comment:            The GeneXpert MRSA Assay (FDA     approved for NASAL specimens     only), is one component of a     comprehensive MRSA colonization     surveillance program. It is not     intended to diagnose MRSA     infection nor to guide or     monitor treatment for     MRSA infections.     RESULT CALLED TO, READ BACK BY AND VERIFIED WITH:     CHEEK, K ON [id number] AT 1625 BY INGLEE  Studies: Dg Chest 2 View  08/11/2012  *RADIOLOGY REPORT*  Clinical Data: Pneumonia.  Left pleural effusion.  CHEST - 2 VIEW  Comparison: 04/06, 04/04, and 08/04/2012 and CT scan dated 08/05/2012  Findings: The infiltrates and atelectasis at both lung bases have resolved.  However, the large loculated fluid collection at the left lung base persists.  There is a small air collection in the superior aspect of this fluid collection.  Heart size and vascularity are normal.  Blebs are noted at the lung apices.  IMPRESSION:  1.  Resolution of the infiltrates/atelectasis at the bases. 2.  Persistent large loculated subpulmonic fluid  collection at the left lung base.   Original Report Authenticated By: Francene Boyers, M.D.    Dg Chest 2 View  08/10/2012  *RADIOLOGY REPORT*  Clinical Data: Left pleural abscess or empyema  CHEST - 2 VIEW  Comparison: Chest radiograph ofApril 4, 2014; chest CT of August 05, 2012.  Findings: Stable mild cardiomegaly.  Right lung is clear.  Large rounded left lower lobe mass like density is again noted is again noted consistent with a large empyema or abscess as described on prior studies.  Associated pleural effusion is noted as well as probable atelectasis of left lower lobe.  IMPRESSION: No significant change seen involving large rounded opacity in left lower lobe consistent with abscess or empyema as described on prior studies.  Associated pleural effusion and atelectasis is noted in left lower lobe.   Original Report Authenticated By: Lupita Raider.,  M.D.    Dg Chest Portable 1 View  08/12/2012  *RADIOLOGY REPORT*  Clinical Data: Post-bronchoscopy and left lower lobe bronchoscopic biopsy.  PORTABLE CHEST - 1 VIEW 08/12/2012 1433 hours:  Comparison: Two-view chest x-ray yesterday, 08/10/2012, 08/04/2012.  Findings: No evidence of left pneumothorax after bronchoscopic biopsy of the left lower lobe.  Persistent mass-like consolidation in the left lower lobe minimally improved since the examination 9 days ago, not significantly changed over the past 4 days. Expiratory image which accounts for the crowded bronchovascular markings elsewhere in both lungs.  Taking this into account, lungs otherwise clear.  Cardiac silhouette normal in size for technique and degree of inspiration.  The  IMPRESSION: No complicating features after bronchoscopic biopsy of the left lower lobe.  Persistent mass-like consolidation in the left lower lobe.  No new abnormalities.   Original Report Authenticated By: Hulan Saas, M.D.     Scheduled Meds: . enoxaparin (LOVENOX) injection  40 mg Subcutaneous Q24H  . feeding supplement   237 mL Oral TID WC  . iron polysaccharides  150 mg Oral BID  . multivitamin with minerals  1 tablet Oral Daily  . pantoprazole  40 mg Oral Daily  . piperacillin-tazobactam (ZOSYN)  IV  3.375 g Intravenous Q8H  . predniSONE  20 mg Oral Q breakfast  . senna-docusate  1 tablet Oral BID   Continuous Infusions:   Active Problems:   Leg edema   Hyponatremia   Anemia   HAP (hospital-acquired pneumonia)   Multiple sclerosis   Hemoptysis   Cavitating mass in left lower lung lobe    Time spent: 15 min    Mahala Menghini [facility]  [facility] Pager [phone].  If 7PM-7AM, please contact night-coverage at [email or web address], password [facility] 08/12/2012, 4:37 PM  LOS: 8 days

## 2012-08-12 NOTE — Progress Notes (Signed)
Bronch w/ video intervention performed, bronchial brushing intervention performed, bronchial biopsy intervention performed, bronchial washing intervention performed.

## 2012-08-13 ENCOUNTER — Encounter (HOSPITAL_COMMUNITY): Payer: Self-pay | Admitting: Pulmonary Disease

## 2012-08-13 LAB — CBC
MCH: 23.9 pg — ABNORMAL LOW (ref 26.0–34.0)
MCHC: 29.5 g/dL — ABNORMAL LOW (ref 30.0–36.0)
Platelets: 643 10*3/uL — ABNORMAL HIGH (ref 150–400)
RBC: 3.52 MIL/uL — ABNORMAL LOW (ref 4.22–5.81)

## 2012-08-13 NOTE — Progress Notes (Signed)
Chaplain saw pt on rounds due to length of stay and possible transition to [facility].  Provide support with pt and son around change in living arrangements.  Pt had been home until first hospitalization in February.  Came to [place] to live with son.  Pt spoke with chaplain about life-giving activities and what he does to feel at home.  Mentioned that he reads bible everyday.  Chaplain provided pt with bible in room.    Will continue to follow for support around discharge plans.   Belva Crome MDiv 1:13 PM

## 2012-08-13 NOTE — Progress Notes (Signed)
Patient has a bed at [facility], patient & son, Donnalee Curry aware. CSW attempting to reach [facility] for insurance authorization - updated clinicals faxed over this morning. Sonto complete admission paperwork @ SNF this afternoon in anticipation of insurance approval & discharge this afternoon. CSW will continue to try to reach insurance case manager.    Unice Bailey, LCSW [facility] Clinical Social Worker cell #: [phone]

## 2012-08-13 NOTE — Progress Notes (Addendum)
Physical Therapy Treatment Patient Details Name: Polk Minor MRN: [id number] DOB: 1956/04/16 Today's Date: 08/13/2012 Time: 3086-5784 PT Time Calculation (min): 14 min  PT Assessment / Plan / Recommendation Comments on Treatment Session  Pt able to ambulate in hallway today tho manual assist was req'd to maintain safe advancement of walker and mod VC's were req'd to encourage pt to maintain UE contact with the RW rather than waving at passersby.    Follow Up Recommendations  SNF     Equipment Recommendations  Rolling walker with 5" wheels;Wheelchair (measurements PT)    Frequency Min 3X/week   Plan Discharge plan remains appropriate    Precautions / Restrictions Precautions Precautions: Fall Precaution Comments: fall, pt ataxic (more UE than LE) Restrictions Weight Bearing Restrictions: No   Pertinent Vitals/Pain Pt denies pain    Mobility  Bed Mobility Bed Mobility: Not assessed Transfers Transfers: Sit to Stand;Stand to Sit Sit to Stand: 4: Min assist;From chair/3-in-1 Stand to Sit: 4: Min assist;To chair/3-in-1 Details for Transfer Assistance: Pt impulsive to stand with mod cues for hand placement and safety. Ambulation/Gait Ambulation/Gait Assistance: 4: Min assist;3: Mod assist Ambulation/Gait: Patient Percentage: 70% Ambulation Distance (Feet): 150 Feet Assistive device: Rolling walker Ambulation/Gait Assistance Details: Pt able to use RW. due to L UE ataxic movements, pt req'd manual assist to advance walker safely. Gait Pattern: Step-through pattern;Ataxic;Trunk flexed;Narrow base of support;Decreased stride length Gait velocity: Somewhat decreased Stairs: No Wheelchair Mobility Wheelchair Mobility: No       PT Goals Acute Rehab PT Goals PT Goal Formulation: With patient Time For Goal Achievement: 08/22/12 Potential to Achieve Goals: Good Pt will go Sit to Stand: with supervision PT Goal: Sit to Stand - Progress: Progressing toward goal Pt will go  Stand to Sit: with supervision PT Goal: Stand to Sit - Progress: Progressing toward goal Pt will Ambulate: 16 - 50 feet;with min assist;with rolling walker PT Goal: Ambulate - Progress: Progressing toward goal  Visit Information  Last PT Received On: 08/13/12 Assistance Needed: +1    Cognition    Good   Balance   Fair  End of Session PT - End of Session Equipment Utilized During Treatment: Gait belt Activity Tolerance: Patient tolerated treatment well Patient left: in chair;with call bell/phone within reach;with chair alarm set   GP     BROWN-SMEDLEY, NICOLE 08/13/2012, 3:29 PM  Felecia Shelling  PTA [facility]  Acute  Rehab Pager      [phone]

## 2012-08-13 NOTE — Progress Notes (Signed)
PULMONARY  / CRITICAL CARE MEDICINE  Name: Devon Glenn MRN: [id number] DOB: 01-Sep-1955    ADMISSION DATE:  08/04/2012 CONSULTATION DATE:  4-1  REFERRING MD :  [facility]:    CHIEF COMPLAINT:  Cough, weight loss, hempotysis  HISTORY OF PRESENT ILLNESS:   57 yo male smoker with cough, hemoptysis, weight loss found to have Lt lower lung mass with post-obstructive pneumonia.  PCCM consulted to evaluate.  SIGNIFICANT EVENTS / STUDIES:  4-1 see ct of chest report 4-2 FOB terminated early due to copious secretions and resp distress.    4-8 Bronchoscopy, BAL, brushing, TBx LLL  CULTURES: 4-1 bc x 2>>neg 4-1 uc>> negative 4-1 sputum>> oral flora 4-2 bronchial washings >> oral flora  ANTIBIOTICS: 4-1 zoysn>>4/08 4-1 vanco>> 4/04 4-2 zithromax>> 4/04 4-8 Augmentin >>   SUBJECTIVE:  Denies chest pain.  Cough decreased.  VITAL SIGNS: Temp:  [97.9 F (36.6 C)-98.4 F (36.9 C)] 97.9 F (36.6 C) (04/09 0655) Pulse Rate:  [68-106] 82 (04/09 0655) Resp:  [13-37] 16 (04/09 0655) BP: (97-164)/(66-131) 103/66 mmHg (04/09 0655) SpO2:  [90 %-100 %] 99 % (04/09 0655)  PHYSICAL EXAMINATION: General:  Thin Neuro: Alert HEENT:  No LAN Cardiovascular: regular Lungs:  Decreased in base left  Abdomen:  Soft +bs Musculoskeletal:  intact Skin: no rashes   Recent Labs Lab 08/07/12 0335 08/08/12 0453 08/10/12 0432  NA 132* 133* 131*  K 3.9 3.8 4.0  CL 94* 94* 95*  CO2 30 32 30  BUN 9 17 20   CREATININE 0.52 0.63 0.74  GLUCOSE 155* 147* 83    Recent Labs Lab 08/10/12 0432 08/11/12 0420 08/13/12 0445  HGB 8.1* 7.9* 8.4*  HCT 27.8* 27.1* 28.5*  WBC 14.6* 18.2* 12.3*  PLT 756* 705* 643*   Dg Chest 2 View  08/11/2012  *RADIOLOGY REPORT*  Clinical Data: Pneumonia.  Left pleural effusion.  CHEST - 2 VIEW  Comparison: 04/06, 04/04, and 08/04/2012 and CT scan dated 08/05/2012  Findings: The infiltrates and atelectasis at both lung bases have resolved.  However, the  large loculated fluid collection at the left lung base persists.  There is a small air collection in the superior aspect of this fluid collection.  Heart size and vascularity are normal.  Blebs are noted at the lung apices.  IMPRESSION:  1.  Resolution of the infiltrates/atelectasis at the bases. 2.  Persistent large loculated subpulmonic fluid collection at the left lung base.   Original Report Authenticated By: Francene Boyers, M.D.    Dg Chest Portable 1 View  08/12/2012  *RADIOLOGY REPORT*  Clinical Data: Post-bronchoscopy and left lower lobe bronchoscopic biopsy.  PORTABLE CHEST - 1 VIEW 08/12/2012 1433 hours:  Comparison: Two-view chest x-ray yesterday, 08/10/2012, 08/04/2012.  Findings: No evidence of left pneumothorax after bronchoscopic biopsy of the left lower lobe.  Persistent mass-like consolidation in the left lower lobe minimally improved since the examination 9 days ago, not significantly changed over the past 4 days. Expiratory image which accounts for the crowded bronchovascular markings elsewhere in both lungs.  Taking this into account, lungs otherwise clear.  Cardiac silhouette normal in size for technique and degree of inspiration.  The  IMPRESSION: No complicating features after bronchoscopic biopsy of the left lower lobe.  Persistent mass-like consolidation in the left lower lobe.  No new abnormalities.   Original Report Authenticated By: Hulan Saas, M.D.    Dg C-arm Bronchoscopy  08/12/2012  CLINICAL DATA: lung mass   C-ARM BRONCHOSCOPY  Fluoroscopy was utilized by  the requesting physician.  No radiographic  interpretation.      ASSESSMENT / PLAN:  Lt lower lung mass with post-obstructive pneumonia with hx of tobacco abuse. Main concern is malignancy. Plan: Abx changed to augment >> recommend 14 day total course of Abx F/u bronchoscopy results from 4/08 >> this can be done as outpt PRN BD's F/u CXR intermittently   Will check back once bronchoscopy results are available.   Okay to d/c from hospital from pulmonary standpoint.  Can have follow up in pulmonary office once bronchoscopy results available.  Coralyn Helling, MD [facility] Pulmonary/Critical Care 08/13/2012, 8:46 AM Pager:  [phone] After 3pm call: [phone]

## 2012-08-13 NOTE — Progress Notes (Signed)
[facility] PROGRESS NOTE  Devon Glenn HQI:[id number] DOB: 04/22/1956 DOA: 08/04/2012 PCP: No primary provider on file.  Devon Glenn is a 57 yo M with MS who presents with cough and a 50-lb weight loss.  CT chest demonstrated a large left lung mass and PNA.  He was seen by PCCM and will underwent bronchoscopy on 4/2-he unfortunately desaturated to the 80's and had copious secretions and resp distress, however Bronchoscopic brushings were taken.  Nursing reports he cough's while he eats He became much less Hypoxic and seemd a lot more stable over period from 42-4.4 and his respiratory status imporved.  He also was found to have no specific indicators that this was malignancy on his brocnhial brushings, and it was noted on rpt Xray his mass in the lung muight be an abcess moreso than malgnancy.  Assessment/Plan  Health care associated pneumonia (hospital-acquired pneumonia)  -  Vancomycin and zosyn with azithromycin 250 daily narrowed to Zosyn monotherapy 4.4.14-this was d/c 4.8.14 as and transitioned to PO Augmentin to complete 14 days therapy ending 08/18/12 -  urine legionella and s. pneumo ag negative -  Sputum culture showing only non-pathogenic flora -  No-growth on blood cultures 4.1.14 so far - SLP evaluated 4.3-no overt signs of aspiration noted - repeat chest x-ray 08/10/2012 still shows a mass present which is unchanged-see below  Lung mass, large and without significant mediastinal/hilar LAD--More likely Empyema/Abcess .   - Pulmonary consulted -preliminary washings/biopsy from Bronchoscopy 4.2.14 results show only inflammation -Unable to get much given a mass that was noted and patient de-sat to 80's with attempt at bronchoscopy.   -Re-attempt at Bronchoscopy done this secretions but a possible mass-labs results should be available by 08/13/2012 however this should not preclude patient from being discharge to skilled nursing facility -Discussed with Dr. Craige Cotta and he agrees  with the same. Patient can be followed up as an outpatient for possible lung carcinoma.  Leg edema, severe, likely due to hypoalbuminemia 2/2  - Nutrition consult when starting diet again - Dietary supplements   - ECHO 4.1 =Ef60-65% with ? Wall motion anomaly - UA for proteinuria showed microalbuminuria - Lower extremity duplex 4.1 showed ruptured baker's cyst - Add SCDs in the mean time  Hyponatremia - ?SIADH from his pulmonary issues-resolving -  ECHO wln LFTs wnl TSH not done, but as resolving, wouldn't persue furhter  -  Gentle hydration -  Serum osms 278 [low], Uosm 802-probably 2/2 to potomania with low solute intake  Leukocytosis:  resolving -steroids started 4.2-thought multifactorial and has resvoled - Trend  Microcytic anemia - anemia panel- shows predominant iron deficiency -would transfuse blood if less than 7.0-rpt in am - Hemoccult stool if further drop below 7 -rpt CBC in am  Thrombocytosis:  May be due to malignancy or pneumonia.  Trending up -  Trend  Multiple sclerosis - patient unable to provide much history regarding this. -  Unclear history of this and unlikely at his age - Neurology input needed as an out-patient to determine what the diagnosis - Work-up seems to have been started with CSF analysis and this was mostly negative [in care everywhere]  Constitutional Hypotension -blood pressures drop to the 90's systolic on sleeping  Diet: regular diet Access:  PIV IVF:  OFF Proph:  lovenox to start post-procedure, SCDs now  Code Status: full code Family Communication: patient's son at bedside (his cell is [phone]) Disposition Plan: pending medically stability-Need SNF-Insurance still to approve. Hopefully tomorrow.   Consultants:  pulmonary  Procedures:  CTa chest  Bronchoscopy 08/06/12  CXR's  Antibiotics:  Vancomycin 4/1 >>4.4  Zosyn 4/1 >> 4/8  Azithro 4/1 >> 4/4  Augmentin 4/8 >>  HPI/Subjective: - no  complaints  Objective: Filed Vitals:   08/12/12 2346 08/13/12 0655 08/13/12 1030 08/13/12 1423  BP: 102/68 103/66 113/68 92/57  Pulse: 89 82 74 63  Temp: 98.4 F (36.9 C) 97.9 F (36.6 C) 97.4 F (36.3 C) 98.7 F (37.1 C)  TempSrc: Oral Oral Oral Oral  Resp: 20 16 16 18   Height:      Weight:      SpO2: 100% 99% 98% 100%    Intake/Output Summary (Last 24 hours) at 08/13/12 1510 Last data filed at 08/13/12 1300  Gross per 24 hour  Intake    840 ml  Output   1125 ml  Net   -285 ml   Filed Weights   08/05/12 0238  Weight: 59.875 kg (132 lb)    Exam:  General:  [name], No acute distress  HEENT:  NCAT, MMM  Cardiovascular:  RRR, nl S1, S2 no mrg, 2+ pulses, warm extremities  Respiratory:  .  No wheezes, rales, or rhonchi.  No increased WOB while on nasal canula.   Data Reviewed: Basic Metabolic Panel:  Recent Labs Lab 08/07/12 0335 08/08/12 0453 08/10/12 0432  NA 132* 133* 131*  K 3.9 3.8 4.0  CL 94* 94* 95*  CO2 30 32 30  GLUCOSE 155* 147* 83  BUN 9 17 20   CREATININE 0.52 0.63 0.74  CALCIUM 8.7 8.7 8.8  MG 2.2  --   --   PHOS 4.0  --   --    CBC:  Recent Labs Lab 08/07/12 0335 08/08/12 0453 08/10/12 0432 08/11/12 0420 08/13/12 0445  WBC 12.4* 9.8 14.6* 18.2* 12.3*  HGB 8.3* 7.6* 8.1* 7.9* 8.4*  HCT 27.4* 25.6* 27.8* 27.1* 28.5*  MCV 78.1 78.3 79.9 80.2 81.0  PLT 626* 648* 756* 705* 643*   BNP (last 3 results)  Recent Labs  08/04/12 2349  PROBNP 377.7*    Recent Results (from the past 240 hour(s))  CULTURE, BLOOD (ROUTINE X 2)     Status: None   Collection Time    08/05/12  1:20 AM      Result Value Range Status   Specimen Description BLOOD RIGHT WRIST   Final   Special Requests BOTTLES DRAWN AEROBIC AND ANAEROBIC 5CC   Final   Culture  Setup Time 08/05/2012 08:41   Final   Culture NO GROWTH 5 DAYS   Final   Report Status 08/11/2012 FINAL   Final  CULTURE, BLOOD (ROUTINE X 2)     Status: None   Collection Time    08/05/12   1:30 AM      Result Value Range Status   Specimen Description BLOOD LEFT HAND   Final   Special Requests BOTTLES DRAWN AEROBIC AND ANAEROBIC 5CC   Final   Culture  Setup Time 08/05/2012 08:41   Final   Culture NO GROWTH 5 DAYS   Final   Report Status 08/11/2012 FINAL   Final  URINE CULTURE     Status: None   Collection Time    08/05/12  2:14 PM      Result Value Range Status   Specimen Description URINE, CLEAN CATCH   Final   Special Requests NONE   Final   Culture  Setup Time 08/05/2012 21:17   Final   Colony Count NO GROWTH  Final   Culture NO GROWTH   Final   Report Status 08/06/2012 FINAL   Final  CULTURE, EXPECTORATED SPUTUM-ASSESSMENT     Status: None   Collection Time    08/05/12 10:56 PM      Result Value Range Status   Specimen Description SPUTUM   Final   Special Requests Normal   Final   Sputum evaluation     Final   Value: THIS SPECIMEN IS ACCEPTABLE. RESPIRATORY CULTURE REPORT TO FOLLOW.   Report Status 08/05/2012 FINAL   Final  CULTURE, RESPIRATORY (NON-EXPECTORATED)     Status: None   Collection Time    08/05/12 10:56 PM      Result Value Range Status   Specimen Description SPUTUM   Final   Special Requests NONE   Final   Gram Stain     Final   Value: RARE WBC PRESENT, PREDOMINANTLY PMN     RARE SQUAMOUS EPITHELIAL CELLS PRESENT     RARE GRAM POSITIVE RODS     RARE GRAM POSITIVE COCCI     IN PAIRS   Culture NORMAL OROPHARYNGEAL FLORA   Final   Report Status 08/08/2012 FINAL   Final  CULTURE, RESPIRATORY (NON-EXPECTORATED)     Status: None   Collection Time    08/06/12 10:26 AM      Result Value Range Status   Specimen Description BRONCHIAL WASHINGS   Final   Special Requests Normal   Final   Gram Stain     Final   Value: FEW WBC PRESENT,BOTH PMN AND MONONUCLEAR     NO SQUAMOUS EPITHELIAL CELLS SEEN     RARE GRAM POSITIVE COCCI     IN PAIRS IN CHAINS   Culture Non-Pathogenic Oropharyngeal-type Flora Isolated.   Final   Report Status 08/08/2012 FINAL    Final  MRSA PCR SCREENING     Status: Abnormal   Collection Time    08/06/12  1:29 PM      Result Value Range Status   MRSA by PCR POSITIVE (*) NEGATIVE Final   Comment:            The GeneXpert MRSA Assay (FDA     approved for NASAL specimens     only), is one component of a     comprehensive MRSA colonization     surveillance program. It is not     intended to diagnose MRSA     infection nor to guide or     monitor treatment for     MRSA infections.     RESULT CALLED TO, READ BACK BY AND VERIFIED WITH:     CHEEK, K ON [id number] AT 1625 BY INGLEE     Studies: Dg Chest Portable 1 View  08/12/2012  *RADIOLOGY REPORT*  Clinical Data: Post-bronchoscopy and left lower lobe bronchoscopic biopsy.  PORTABLE CHEST - 1 VIEW 08/12/2012 1433 hours:  Comparison: Two-view chest x-ray yesterday, 08/10/2012, 08/04/2012.  Findings: No evidence of left pneumothorax after bronchoscopic biopsy of the left lower lobe.  Persistent mass-like consolidation in the left lower lobe minimally improved since the examination 9 days ago, not significantly changed over the past 4 days. Expiratory image which accounts for the crowded bronchovascular markings elsewhere in both lungs.  Taking this into account, lungs otherwise clear.  Cardiac silhouette normal in size for technique and degree of inspiration.  The  IMPRESSION: No complicating features after bronchoscopic biopsy of the left lower lobe.  Persistent mass-like consolidation in the left lower lobe.  No new abnormalities.   Original Report Authenticated By: Hulan Saas, M.D.    Dg C-arm Bronchoscopy  08/12/2012  CLINICAL DATA: lung mass   C-ARM BRONCHOSCOPY  Fluoroscopy was utilized by the requesting physician.  No radiographic  interpretation.      Scheduled Meds: . amoxicillin-clavulanate  2 tablet Oral Q12H  . enoxaparin (LOVENOX) injection  40 mg Subcutaneous Q24H  . feeding supplement  237 mL Oral TID WC  . iron polysaccharides  150 mg Oral BID  .  multivitamin with minerals  1 tablet Oral Daily  . pantoprazole  40 mg Oral Daily  . senna-docusate  1 tablet Oral BID   Continuous Infusions:   Active Problems:   Leg edema   Hyponatremia   Anemia   HAP (hospital-acquired pneumonia)   Multiple sclerosis   Hemoptysis   Cavitating mass in left lower lung lobe    Time spent: 25 min    Pamella Pert  [facility] Pager [phone].  If 7PM-7AM, please contact night-coverage at [email or web address], password [facility] 08/13/2012, 3:10 PM  LOS: 9 days

## 2012-08-14 DIAGNOSIS — G35 Multiple sclerosis: Secondary | ICD-10-CM

## 2012-08-14 DIAGNOSIS — G35D Multiple sclerosis, unspecified: Secondary | ICD-10-CM

## 2012-08-14 MED ORDER — AMOXICILLIN-POT CLAVULANATE ER 1000-62.5 MG PO TB12: 2.0000 | ORAL_TABLET | Freq: Two times a day (BID) | ORAL | Status: DC

## 2012-08-14 MED ORDER — ENSURE COMPLETE PO LIQD: 237.0000 mL | Freq: Three times a day (TID) | ORAL | Status: AC

## 2012-08-14 NOTE — Progress Notes (Signed)
Report given to Ola,RN at [facility]. Pt will be transported via EMS.

## 2012-08-14 NOTE — Progress Notes (Signed)
PULMONARY  / CRITICAL CARE MEDICINE  Name: Devon Glenn MRN: [id number] DOB: 1955/06/10    ADMISSION DATE:  08/04/2012 CONSULTATION DATE:  4-1  REFERRING MD :  [facility]:    CHIEF COMPLAINT:  Cough, weight loss, hempotysis  HISTORY OF PRESENT ILLNESS:   57 yo male smoker with cough, hemoptysis, weight loss found to have Lt lower lung mass with post-obstructive pneumonia.  PCCM consulted to evaluate.  SIGNIFICANT EVENTS / STUDIES:  4-1 see ct of chest report 4-2 FOB terminated early due to copious secretions and resp distress.    4-8 Bronchoscopy, BAL, brushing, TBx LLL >> negative for malignancy  CULTURES: 4-1 bc x 2>>neg 4-1 uc>> negative 4-1 sputum>> oral flora 4-2 bronchial washings >> oral flora  ANTIBIOTICS: 4-1 zoysn>>4/08 4-1 vanco>> 4/04 4-2 zithromax>> 4/04 4-8 Augmentin >>   SUBJECTIVE:  Feels well.  VITAL SIGNS: Temp:  [97.4 F (36.3 C)-98.8 F (37.1 C)] 98.5 F (36.9 C) (04/10 0447) Pulse Rate:  [63-103] 89 (04/10 0447) Resp:  [16-18] 16 (04/10 0447) BP: (92-113)/(56-71) 99/66 mmHg (04/10 0447) SpO2:  [97 %-100 %] 99 % (04/10 0447)  PHYSICAL EXAMINATION: General:  Thin Neuro: Alert HEENT:  No LAN Cardiovascular: regular Lungs:  Decreased in base left  Abdomen:  Soft +bs Musculoskeletal:  intact Skin: no rashes   Recent Labs Lab 08/08/12 0453 08/10/12 0432  NA 133* 131*  K 3.8 4.0  CL 94* 95*  CO2 32 30  BUN 17 20  CREATININE 0.63 0.74  GLUCOSE 147* 83    Recent Labs Lab 08/10/12 0432 08/11/12 0420 08/13/12 0445  HGB 8.1* 7.9* 8.4*  HCT 27.8* 27.1* 28.5*  WBC 14.6* 18.2* 12.3*  PLT 756* 705* 643*   Dg Chest Portable 1 View  08/12/2012  *RADIOLOGY REPORT*  Clinical Data: Post-bronchoscopy and left lower lobe bronchoscopic biopsy.  PORTABLE CHEST - 1 VIEW 08/12/2012 1433 hours:  Comparison: Two-view chest x-ray yesterday, 08/10/2012, 08/04/2012.  Findings: No evidence of left pneumothorax after bronchoscopic biopsy  of the left lower lobe.  Persistent mass-like consolidation in the left lower lobe minimally improved since the examination 9 days ago, not significantly changed over the past 4 days. Expiratory image which accounts for the crowded bronchovascular markings elsewhere in both lungs.  Taking this into account, lungs otherwise clear.  Cardiac silhouette normal in size for technique and degree of inspiration.  The  IMPRESSION: No complicating features after bronchoscopic biopsy of the left lower lobe.  Persistent mass-like consolidation in the left lower lobe.  No new abnormalities.   Original Report Authenticated By: Hulan Saas, M.D.    Dg C-arm Bronchoscopy  08/12/2012  CLINICAL DATA: lung mass   C-ARM BRONCHOSCOPY  Fluoroscopy was utilized by the requesting physician.  No radiographic  interpretation.      ASSESSMENT / PLAN:  Lt lower lung mass like consolidation with hx of tobacco abuse. Concern is malignancy, but bronchoscopy results negative for malignancy.  Only show reactive bronchial epithelial cells with acute inflammation. Plan: Continue Abx per primary team Plan to follow up CXR as outpt in two weeks >> then consider f/u CT chest vs PET scan with consideration for additional biopsy attempt PRN BD's Will need outpt PFT's  Will have pt schedule for follow up in pulmonary office in two weeks.  Okay for d/c from hospital from pulmonary standpoint.  Coralyn Helling, MD [facility] 08/14/2012, 7:55 AM Pager:  [phone] After 3pm call: [phone]

## 2012-08-14 NOTE — Progress Notes (Signed)
Patient is set to discharge to [facility] SNF today. Patient & son, Tammy Sours aware. [facility] called for 11:30 pickup. Discharge packet in [place].   Clinical Social Work Department CLINICAL SOCIAL WORK PLACEMENT NOTE 08/14/2012  Patient:  Devon Glenn, Devon Glenn  Account Number:  [id number] Admit date:  08/04/2012  Clinical Social Worker:  Doroteo Glassman  Date/time:  08/10/2012 01:00 PM  Clinical Social Work is seeking post-discharge placement for this patient at the following level of care:   SKILLED NURSING   (*CSW will update this form in Epic as items are completed)   08/10/2012  Patient/family provided with [facility] Department of Clinical Social Work's list of facilities offering this level of care within the geographic area requested by the patient (or if unable, by the patient's family).  08/10/2012  Patient/family informed of their freedom to choose among providers that offer the needed level of care, that participate in Medicare, Medicaid or managed care program needed by the patient, have an available bed and are willing to accept the patient.  08/10/2012  Patient/family informed of [facility]' ownership interest in [facility], as well as of the fact that they are under no obligation to receive care at this facility.  PASARR submitted to EDS on 08/10/2012 PASARR number received from E[employer] on 08/10/2012  FL2 transmitted to all facilities in geographic area requested by pt/family on  08/10/2012 FL2 transmitted to all facilities within larger geographic area on   Patient informed that his/her managed care company has contracts with or will negotiate with  certain facilities, including the following:     Patient/family informed of bed offers received:  08/11/2012 Patient chooses bed at [facility] [place] Physician recommends and patient chooses bed at    Patient to be transferred to [facility] on   08/14/2012 Patient to be transferred to facility by [facility]  The following physician request were entered in Epic:   Additional Comments:  Unice Bailey, LCSW [facility] Clinical Social Worker cell #: [phone]

## 2012-08-14 NOTE — Discharge Summary (Addendum)
Physician Discharge Summary  Devon Glenn ZOX:[id number] DOB: March 26, 1956 DOA: 08/04/2012  PCP: No primary provider on file.  Admit date: 08/04/2012 Discharge date: 08/14/2012  Time spent: 45 minutes  Recommendations for Outpatient Follow-up:  1. Continue Augmentin until 08/18/2012 2. Repeat CXR as an outpatient in 2 weeks and follow up with pulmonary for further evaluation for lung mass.   3. Please establish care with a PCP and follow up in 2-4 weeks.   Discharge Diagnoses:  Active Problems:   Leg edema   Hyponatremia   Anemia   HAP (hospital-acquired pneumonia)   Multiple sclerosis   Hemoptysis   Cavitating mass in left lower lung lobe  Discharge Condition: stable  Diet recommendation: regular  Filed Weights   08/05/12 0238  Weight: 59.875 kg (132 lb)    History of present illness:  [name] is a 57 yo M with MS who presents with cough and a 50-lb weight loss. CT chest demonstrated a large left lung mass and PNA. He was seen by PCCM and will underwent bronchoscopy on 4/2-he unfortunately desaturated to the 80's and had copious secretions and resp distress, however Bronchoscopic brushings were taken. Nursing reports he cough's while he eats  He became much less Hypoxic and seemd a lot more stable over period from 42-4.4 and his respiratory status imporved. He also was found to have no specific indicators that this was malignancy on his brocnhial brushings, and it was noted on rpt Xray his mass in the lung muight be an abcess moreso than malgnancy.  Hospital Course:  Health care associated pneumonia (hospital-acquired pneumonia) Initially patient was started on Vancomycin and zosyn with azithromycin 250 daily narrowed to Zosyn monotherapy 4.4.14-this was d/c 4.8.14 as and transitioned to PO Augmentin to complete 14 days therapy ending 08/18/12. Urine legionella and s. pneumo ag negative and sputum culture showing only non-pathogenic flora. Speech evaluated patient and there  are no overt signs of aspiration noted  Lung mass, large and without significant mediastinal/hilar LAD. Washings/biopsy from Bronchoscopy 4.2.14 results show only inflammation. Discussed with Dr. Craige Cotta (Pulmonary) and he agrees that patient can be followed up as an outpatient for possible lung carcinoma.  Leg edema, severe, likely due to hypoalbuminemia. Continue adequate nutrition.    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   Leukocytosis: resolving steroids started 4.2-thought multifactorial and has resvoled  Microcytic anemia anemia panel- shows predominant iron deficiency. Stable.  Thrombocytosis: May be due to malignancy or pneumonia. Stable.  Multiple sclerosis - patient unable to provide much history regarding this. Patient will need follow up appointment with Neurology. Work-up seems to have been started with CSF analysis as an outpatient and this was mostly negative. Constitutional Hypotension  Blood pressures drop to the 90's systolic on sleeping, asymptomatic.   Procedures:  2D echo Study Conclusions - Left ventricle: The cavity size was normal. Wall thickness was normal. Systolic function was hyperdynamic. The estimated ejection fraction was in the range of 60% to 65%. Although no diagnostic regional wall motion abnormality was identified, this possibility cannot be completely excluded on the basis of this study. - Mitral valve: Mild diffuse thickening, with involvement of chords. - Right ventricle: Systolic function was hyperdynamic. - Atrial septum: The septum bowed from right to left, consistent with increased right atrial pressure. No defect or patent foramen ovale was identified. - Pericardium, extracardiac: A trivial pericardial effusion was identified. There was a left pleural effusion.   Bronchoscopy   Dupplex lower extremity  Summary:  - No  evidence of deep vein or superficial thrombosis involving the right lower extremity and left  lower extremity. - Incidental findingsan area of mixed echoes coursing form the popliteal fossa 3.9cm into the proximal calf consistent with aRuptured Baker's Cyst on the left. - No evidence of Baker's cyst on the right. - Mild enlargement of the right inguinal lymph nodes Other specific details can be found in the table(s) above. Prepared and Electronically Authenticated by  Consultations:  Pulmonary  Discharge Exam: Filed Vitals:   08/13/12 1423 08/13/12 2021 08/14/12 0220 08/14/12 0447  BP: 92/57 95/56 102/71 99/66  Pulse: 63 103 83 89  Temp: 98.7 F (37.1 C) 98.7 F (37.1 C) 98.8 F (37.1 C) 98.5 F (36.9 C)  TempSrc: Oral Oral Oral Oral  Resp: 18 18 16 16   Height:      Weight:      SpO2: 100% 100% 97% 99%   General: NAD Cardiovascular: RRR without MRG Respiratory: CTA biL, decreased breath sounds left base.   Discharge Instructions    Medication List    STOP taking these medications       amLODipine 5 MG tablet  Commonly known as:  NORVASC      TAKE these medications       amoxicillin-clavulanate 1000-62.5 MG per tablet  Commonly known as:  AUGMENTIN XR  Take 2 tablets by mouth every 12 (twelve) hours.     feeding supplement Liqd  Take 237 mLs by mouth 3 (three) times daily with meals.     furosemide 20 MG tablet  Commonly known as:  LASIX  Take 20 mg by mouth 2 (two) times daily.     iron polysaccharides 150 MG capsule  Commonly known as:  NIFEREX  Take 150 mg by mouth 2 (two) times daily.     pantoprazole 40 MG tablet  Commonly known as:  PROTONIX  Take 40 mg by mouth daily.     potassium chloride 10 MEQ tablet  Commonly known as:  K-DUR  Take 10 mEq by mouth 2 (two) times daily.       Follow-up Information   Follow up with SOOD,VINEET, MD. Schedule an appointment as soon as possible for a visit in 2 weeks.   Contact information:   [address] [place] [address] [phone]       Follow up with [facility]. Schedule an appointment as soon as possible for a visit in 2 weeks.   Contact information:   [address] [place] [address] [phone]      The results of significant diagnostics from this hospitalization (including imaging, microbiology, ancillary and laboratory) are listed below for reference.    Significant Diagnostic Studies: Dg Chest 2 View  08/11/2012  *RADIOLOGY REPORT*  Clinical Data: Pneumonia.  Left pleural effusion.  CHEST - 2 VIEW  Comparison: 04/06, 04/04, and 08/04/2012 and CT scan dated 08/05/2012  Findings: The infiltrates and atelectasis at both lung bases have resolved.  However, the large loculated fluid collection at the left lung base persists.  There is a small air collection in the superior aspect of this fluid collection.  Heart size and vascularity are normal.  Blebs are noted at the lung apices.  IMPRESSION:  1.  Resolution of the infiltrates/atelectasis at the bases. 2.  Persistent large loculated subpulmonic fluid collection at the left lung base.   Original Report Authenticated By: Francene Boyers, M.D.    Dg Chest 2 View  08/10/2012  *RADIOLOGY REPORT*  Clinical Data: Left pleural abscess or empyema  CHEST - 2 VIEW  Comparison: Chest radiograph ofApril 4, 2014; chest CT of August 05, 2012.  Findings: Stable mild cardiomegaly.  Right lung is clear.  Large rounded left lower lobe mass like density is again noted is again noted consistent with a large empyema or abscess as described on prior studies.  Associated pleural effusion is noted as well as probable atelectasis of left lower lobe.  IMPRESSION: No significant change seen involving large rounded opacity in left lower lobe consistent with abscess or empyema as described on prior studies.  Associated pleural effusion and atelectasis is noted in left lower lobe.   Original Report Authenticated By: Lupita Raider.,  M.D.    Dg Chest 2 View  08/08/2012  **ADDENDUM** CREATED: 08/08/2012 09:07:22   Findings discussed with Dr. Mahala Menghini  08/08/2012 at 9:05.  **END ADDENDUM** SIGNED BY: Genevive Bi, M.D.   08/08/2012  *RADIOLOGY REPORT*  Clinical Data: Follow-up left lung mass  CHEST - 2 VIEW  Comparison: Chest radiograph 08/07/2012  Findings: Stable enlarged cardiac silhouette. There is a large rounded left lower lobe mass like density again demonstrated. There is a small locule of gas within this masslike fluid collection seen on lateral projection.  There is right basilar atelectasis which is similar.  There is some improvement air space disease seen on prior.  There is a bullous change at the right lung apex which is confirmed on comparison CT.  On CT exam would include empyema and pulmonary abscess in the differential.  IMPRESSION:  1.  Persistent large left  pleural abscess versus empyema versus mass.  2.  Improvement in right lower lobe air space disease.   Original Report Authenticated By: Genevive Bi, M.D.    Dg Chest 2 View (if Patient Has Fever And/or Copd)  08/04/2012  *RADIOLOGY REPORT*  Clinical Data: Cough, congestion, leg swelling.  CHEST - 2 VIEW  Comparison: None.  Findings: There is a large opacity in the left lower lung probably representing consolidation due to pneumonia.  Underlying mass lesion is not excluded and follow up after resolution of acute symptoms is recommended.  The heart size appears normal but is obscured by the left parenchymal process.  The right lung appears clear and expanded.  No blunting of costophrenic angles.  No pneumothorax. Scarring and emphysematous changes in the lung apices.  IMPRESSION: Large opacity in the left lower lung likely to represent consolidation due to pneumonia.  Follow up after resolution of acute symptoms is recommended to exclude underlying mass lesion.   Original Report Authenticated By: Burman Nieves, M.D.    Ct Angio Chest W/cm &/or Wo Cm  08/05/2012  *RADIOLOGY REPORT*  Clinical Data: Leg swelling  CT ANGIOGRAPHY CHEST   Technique:  Multidetector CT imaging of the chest using the standard protocol during bolus administration of intravenous contrast. Multiplanar reconstructed images including MIPs were obtained and reviewed to evaluate the vascular anatomy.  Contrast:  100 ml Omnipaque 350  Comparison: None.  Findings: There are no filling defects in the pulmonary arterial tree to suggest acute pulmonary thromboembolism.  There is mass-like consolidation extending from the left inferior hilum towards the left lung base.  There is abrupt occlusion of the left lower lobe airways either due to endobronchial mass or mucous plugging.  There is a large 10.1 x 6.6 by 8.9 cm rounded fluid collection within the left lower lobe.  There is scattered airspace disease in the right middle lobe, right lower  lobe, and left upper lobe.  Bronchiectasis at the right lung base is noted.  There is no abnormal mediastinal adenopathy by measurement criteria.  Sub centimeter short axis diameter nodes are noted.  The patient is cachectic.  Bullous changes at the lung apices are noted.  No destructive bone lesion.  The liver is diffusely heterogeneous in enhancement.  Moderate left pleural effusion is present.  T8 superior end plate depression fracture is of indeterminate age.  IMPRESSION: No evidence of acute pulmonary thromboembolism.  Mass-like consolidation in the left lower lobe.  Left lower lobe air placed abruptly and either due to mucous plugging or endobronchial lesion.  Bronchoscopy may be helpful.  Large loculated fluid collection in the left lower lobe of unknown significance.  Patchy consolidation throughout the other lobes.  Left pleural effusion.  Heterogeneous enhancement in the liver which may simply be due to phase of contrast.  Diffuse infiltration with tumor cannot be excluded.  Mild superior T8 compression fracture of indeterminate age.   Original Report Authenticated By: Jolaine Click, M.D.    Dg Chest Portable 1 View  08/12/2012   *RADIOLOGY REPORT*  Clinical Data: Post-bronchoscopy and left lower lobe bronchoscopic biopsy.  PORTABLE CHEST - 1 VIEW 08/12/2012 1433 hours:  Comparison: Two-view chest x-ray yesterday, 08/10/2012, 08/04/2012.  Findings: No evidence of left pneumothorax after bronchoscopic biopsy of the left lower lobe.  Persistent mass-like consolidation in the left lower lobe minimally improved since the examination 9 days ago, not significantly changed over the past 4 days. Expiratory image which accounts for the crowded bronchovascular markings elsewhere in both lungs.  Taking this into account, lungs otherwise clear.  Cardiac silhouette normal in size for technique and degree of inspiration.  The  IMPRESSION: No complicating features after bronchoscopic biopsy of the left lower lobe.  Persistent mass-like consolidation in the left lower lobe.  No new abnormalities.   Original Report Authenticated By: Hulan Saas, M.D.    Dg Chest Port 1 View  08/07/2012  *RADIOLOGY REPORT*  Clinical Data: Follow-up effusions.  PORTABLE CHEST - 1 VIEW  Comparison: 08/04/2012.  CT chest 08/05/2012.  The  Findings: Persistent opacity in the left lung base appears stable. There is interval increase of airspace and interstitial disease throughout both lungs suggesting developing pneumonia or edema. Heart size is increased.  No pneumothorax.  IMPRESSION: Stable appearance of large mass-like opacity in the left lung base. Increasing airspace and interstitial disease throughout both lungs suggesting developing edema or pneumonia.   Original Report Authenticated By: Burman Nieves, M.D.    Dg C-arm Bronchoscopy  08/12/2012  CLINICAL DATA: lung mass   C-ARM BRONCHOSCOPY  Fluoroscopy was utilized by the requesting physician.  No radiographic  interpretation.      Microbiology: Recent Results (from the past 240 hour(s))  CULTURE, BLOOD (ROUTINE X 2)     Status: None   Collection Time    08/05/12  1:20 AM      Result Value Range Status    Specimen Description BLOOD RIGHT WRIST   Final   Special Requests BOTTLES DRAWN AEROBIC AND ANAEROBIC 5CC   Final   Culture  Setup Time 08/05/2012 08:41   Final   Culture NO GROWTH 5 DAYS   Final   Report Status 08/11/2012 FINAL   Final  CULTURE, BLOOD (ROUTINE X 2)     Status: None   Collection Time    08/05/12  1:30 AM      Result Value Range Status   Specimen Description BLOOD  LEFT HAND   Final   Special Requests BOTTLES DRAWN AEROBIC AND ANAEROBIC 5CC   Final   Culture  Setup Time 08/05/2012 08:41   Final   Culture NO GROWTH 5 DAYS   Final   Report Status 08/11/2012 FINAL   Final  URINE CULTURE     Status: None   Collection Time    08/05/12  2:14 PM      Result Value Range Status   Specimen Description URINE, CLEAN CATCH   Final   Special Requests NONE   Final   Culture  Setup Time 08/05/2012 21:17   Final   Colony Count NO GROWTH   Final   Culture NO GROWTH   Final   Report Status 08/06/2012 FINAL   Final  CULTURE, EXPECTORATED SPUTUM-ASSESSMENT     Status: None   Collection Time    08/05/12 10:56 PM      Result Value Range Status   Specimen Description SPUTUM   Final   Special Requests Normal   Final   Sputum evaluation     Final   Value: THIS SPECIMEN IS ACCEPTABLE. RESPIRATORY CULTURE REPORT TO FOLLOW.   Report Status 08/05/2012 FINAL   Final  CULTURE, RESPIRATORY (NON-EXPECTORATED)     Status: None   Collection Time    08/05/12 10:56 PM      Result Value Range Status   Specimen Description SPUTUM   Final   Special Requests NONE   Final   Gram Stain     Final   Value: RARE WBC PRESENT, PREDOMINANTLY PMN     RARE SQUAMOUS EPITHELIAL CELLS PRESENT     RARE GRAM POSITIVE RODS     RARE GRAM POSITIVE COCCI     IN PAIRS   Culture NORMAL OROPHARYNGEAL FLORA   Final   Report Status 08/08/2012 FINAL   Final  CULTURE, RESPIRATORY (NON-EXPECTORATED)     Status: None   Collection Time    08/06/12 10:26 AM      Result Value Range Status   Specimen Description BRONCHIAL  WASHINGS   Final   Special Requests Normal   Final   Gram Stain     Final   Value: FEW WBC PRESENT,BOTH PMN AND MONONUCLEAR     NO SQUAMOUS EPITHELIAL CELLS SEEN     RARE GRAM POSITIVE COCCI     IN PAIRS IN CHAINS   Culture Non-Pathogenic Oropharyngeal-type Flora Isolated.   Final   Report Status 08/08/2012 FINAL   Final  MRSA PCR SCREENING     Status: Abnormal   Collection Time    08/06/12  1:29 PM      Result Value Range Status   MRSA by PCR POSITIVE (*) NEGATIVE Final   Comment:            The GeneXpert MRSA Assay (FDA     approved for NASAL specimens     only), is one component of a     comprehensive MRSA colonization     surveillance program. It is not     intended to diagnose MRSA     infection nor to guide or     monitor treatment for     MRSA infections.     RESULT CALLED TO, READ BACK BY AND VERIFIED WITH:     CHEEK, K ON [id number] AT 1625 BY INGLEE     Labs: Basic Metabolic Panel:  Recent Labs Lab 08/08/12 0453 08/10/12 0432  NA 133* 131*  K 3.8 4.0  CL  94* 95*  CO2 32 30  GLUCOSE 147* 83  BUN 17 20  CREATININE 0.63 0.74  CALCIUM 8.7 8.8   CBC:  Recent Labs Lab 08/08/12 0453 08/10/12 0432 08/11/12 0420 08/13/12 0445  WBC 9.8 14.6* 18.2* 12.3*  HGB 7.6* 8.1* 7.9* 8.4*  HCT 25.6* 27.8* 27.1* 28.5*  MCV 78.3 79.9 80.2 81.0  PLT 648* 756* 705* 643*   BNP: BNP (last 3 results)  Recent Labs  08/04/12 2349  PROBNP 377.7*    Signed:  GHERGHE, COSTIN  [facility] 08/14/2012, 9:58 AM

## 2012-08-15 ENCOUNTER — Telehealth: Payer: Self-pay | Admitting: Pulmonary Disease

## 2012-08-15 NOTE — Telephone Encounter (Signed)
Spoke with Verlon Au, states patient recently discharged 08/14/12 and needs 2week f/u with Dr. Craige Cotta.: Recommendations for Outpatient Follow-up:  1. Continue Augmentin until 08/18/2012 2. Repeat CXR as an outpatient in 2 weeks and follow up with pulmonary for further evaluation for lung mass.  3. Please establish care with a PCP and follow up in 2-4 weeks.   Dr. Evlyn Courier scheduled is booked up in next two weeks, Dr. Deanne Coffer would it be ok to dbl book patient for HFU?  Please advise, thank you!!   Verlon Au requesting to hold off return call until Monday April 14,2014 when she returns to work.

## 2012-08-15 NOTE — Telephone Encounter (Signed)
Can schedule HFU with Tammy.

## 2012-08-15 NOTE — Telephone Encounter (Signed)
Pt scheduled 08/28/12 at 10:30 w/ TP. Nothing further was needed.

## 2012-08-27 ENCOUNTER — Non-Acute Institutional Stay (SKILLED_NURSING_FACILITY): Payer: BC Managed Care – PPO | Admitting: Internal Medicine

## 2012-08-27 ENCOUNTER — Encounter: Payer: Self-pay | Admitting: Internal Medicine

## 2012-08-27 DIAGNOSIS — E871 Hypo-osmolality and hyponatremia: Secondary | ICD-10-CM

## 2012-08-27 DIAGNOSIS — G35D Multiple sclerosis, unspecified: Secondary | ICD-10-CM

## 2012-08-27 DIAGNOSIS — D649 Anemia, unspecified: Secondary | ICD-10-CM

## 2012-08-27 DIAGNOSIS — G35 Multiple sclerosis: Secondary | ICD-10-CM

## 2012-08-27 DIAGNOSIS — R042 Hemoptysis: Secondary | ICD-10-CM

## 2012-08-27 DIAGNOSIS — J984 Other disorders of lung: Secondary | ICD-10-CM

## 2012-08-27 DIAGNOSIS — R609 Edema, unspecified: Secondary | ICD-10-CM

## 2012-08-27 DIAGNOSIS — R600 Localized edema: Secondary | ICD-10-CM

## 2012-08-27 DIAGNOSIS — Y95 Nosocomial condition: Secondary | ICD-10-CM

## 2012-08-27 DIAGNOSIS — J189 Pneumonia, unspecified organism: Secondary | ICD-10-CM

## 2012-08-27 NOTE — Progress Notes (Signed)
Date: 10/26/2012  MRN:  [id number] Name:  Devon Glenn Sex:  male Age:  57 y.o. DOB:07/22/1955                     Facility/Room:  [facility] Level Of Care:  SNF Provider: Kermit Balo, DO, CMD  Code Status:  Full code  Allergies: Allergies  Allergen Reactions  . Penicillins     Unknown - tolerated Zosyn     Chief Complaint  Patient presents with  . Hospitalization Follow-up    cavitating lung mass   HPI:  57 yo male with h/o multiple sclerosis was admitted here for short term rehab s/p hospitalization for hemoptysis, respiratory failure, and cavitating mass in left lower lobe with associated pneumonia.  He was initially treated with vanc and zosyn and then transitioned to augmentin.  He completed his augmentin therapy here on 08/17/12.  He was tested for step and legionella which were both negative.  His speech eval actually showed no evidence of aspiration.  He had a bronchoscopy 08/06/12 and is to have a f/u CXR and appt with Dr. Craige Cotta (pulmonary) due to concerns that the cavitating mass may be malignant (vs. Abscess).  His stay was complicated by edema thought to be due to hypoalbuminemia, hyponatremia due to SIADH, steroid-induced leukocytosis, iron deficiency anemia, and thrombocytosis.  He also requires neuro f/u for his MS.    Past Medical History  Diagnosis Date  . Multiple sclerosis   . Pneumonia     bacterial  . Edema   . Anemia   . Hypertension   . Reflux esophagitis   . MRSA (methicillin resistant staph aureus) culture positive   . Hemoptysis   . Swelling, mass, or lump in chest     Past Surgical History  Procedure Laterality Date  . Lipoma removal    . Video bronchoscopy N/A 08/06/2012    Procedure: VIDEO BRONCHOSCOPY WITH FLUORO;  Surgeon: Storm Frisk, MD;  Location: [facility] END[facility];  Service: Cardiopulmonary;  Laterality: N/A;  . Video bronchoscopy Bilateral 08/12/2012    Procedure: VIDEO BRONCHOSCOPY WITH FLUORO;  Surgeon: Coralyn Helling, MD;  Location: [facility]  ENDOSCOPY;  Service: Cardiopulmonary;  Laterality: Bilateral;     Procedures: 08/05/12:  CT angio chest: no evidence of acute PE, mass-like consolidation in LLL, LLL air placed abruptly and either due to muous plugging or endobronchial lesion, bronchoscopy may be helpful.  lge loculated fluid collection in LLL of unknown significance, patchy consolidation throughout other lobes, left pleural effusion, heterogeneous enhancement in liver may simply be due to phase of contrast.  Diffuse infiltration of tumor cannot be excluded.  Mild superior T8 compression fx cannot be excluded.  Echo:  LV systolic function hyperdynamic with EF 60-65%, trivial pericardial effusion, left pleural effusion Duplex LE:  No dvt or superficial thrombosis, left baker's cyst, mild right inguinal lymphadenopathy 08/11/12 CXR:  Resolution of infiltrates/atelectasiss at bases, persistent large loculated subpulmonic fluid collection at left lung base  Consultants: Dr. Daiva Huge (pulmonary) [facility]  Current Outpatient Prescriptions  Medication Sig Dispense Refill  . furosemide (LASIX) 20 MG tablet Take 20 mg by mouth 2 (two) times daily.      . iron polysaccharides (NIFEREX) 150 MG capsule Take 150 mg by mouth 2 (two) times daily.      Marland Kitchen omeprazole (PRILOSEC) 20 MG capsule Take 20 mg by mouth daily.      . potassium chloride (K-DUR) 10 MEQ tablet Take 10 mEq by mouth 2 (two) times daily.      Marland Kitchen  amLODipine (NORVASC) 5 MG tablet Take 5 mg by mouth daily.       . feeding supplement (ENSURE COMPLETE) LIQD Take 237 mLs by mouth 3 (three) times daily with meals.  237 mL  90   No current facility-administered medications for this visit.    There is no immunization history on file for this patient.  History  Substance Use Topics  . Smoking status: Former Smoker -- 10.00 packs/day for 40 years    Types: Pipe    Quit date: 06/06/2012  . Smokeless tobacco: Never Used  . Alcohol Use: No    Family History  Problem  Relation Age of Onset  . Breast cancer Mother   . Cancer Father   . Breast cancer Sister     Review of Systems  Constitutional: Negative for fever and chills.  Respiratory: Positive for cough and sputum production. Negative for shortness of breath.   Cardiovascular: Negative for chest pain.  Gastrointestinal: Negative for heartburn and abdominal pain.  Genitourinary: Negative for dysuria.  Musculoskeletal: Negative for myalgias.  Neurological: Negative for dizziness and headaches.    Vital signs: BP 104/65  Pulse 80  Temp(Src) 98.2 F (36.8 C)  Resp 20  Physical Exam  Constitutional: He is oriented to person, place, and time.  Chronically ill appearing middle aged male  HENT:  Head: Normocephalic and atraumatic.  Right Ear: External ear normal.  Left Ear: External ear normal.  Nose: Nose normal.  Mouth/Throat: Oropharynx is clear and moist. No oropharyngeal exudate.  Eyes: EOM are normal. Pupils are equal, round, and reactive to light. No scleral icterus.  Neck: Normal range of motion. No JVD present. No tracheal deviation present. No thyromegaly present.  Cardiovascular: Normal rate, regular rhythm, normal heart sounds and intact distal pulses.   Pulmonary/Chest: Effort normal and breath sounds normal.  Abdominal: Soft. Bowel sounds are normal. He exhibits no distension and no mass. There is no tenderness.  Musculoskeletal: He exhibits no edema and no tenderness.  Neurological: He is alert and oriented to person, place, and time.  Skin: Skin is warm and dry.  Psychiatric: He has a normal mood and affect.  Plan: Cavitating mass in left lower lung lobe Completed his course of augmentin here on 4/13.  Continue with aspiration precautions.  F/u with Dr. Craige Cotta as planned.  Leg edema Stable, elevate at rest.    Hyponatremia Had improved, f/u bmp.  Anemia Hgb in 8s.  F/u cbc.  Continue iron supplement.  HAP (hospital-acquired pneumonia) With associated cavitating  mass--treated with IV abx.  Now to f/u with Dr. Craige Cotta after repeat CXR to assess for resolution of mass.    Multiple sclerosis Has had for several years.  Relapsing remitting.  To f/u with his neurologist.  Hemoptysis Improved.  Thought to be due to HCAP with cavitating mass.  For f/u imaging.

## 2012-08-27 NOTE — Assessment & Plan Note (Addendum)
Completed his course of augmentin here on 4/13.  Continue with aspiration precautions.  F/u with Dr. Craige Cotta as planned.

## 2012-08-28 ENCOUNTER — Inpatient Hospital Stay: Payer: BC Managed Care – PPO | Admitting: Adult Health

## 2012-08-29 ENCOUNTER — Encounter: Payer: Self-pay | Admitting: Diagnostic Neuroimaging

## 2012-08-29 ENCOUNTER — Ambulatory Visit (INDEPENDENT_AMBULATORY_CARE_PROVIDER_SITE_OTHER): Payer: BC Managed Care – PPO | Admitting: Diagnostic Neuroimaging

## 2012-08-29 VITALS — BP 99/56 | HR 84 | Temp 98.5°F | Ht 73.0 in | Wt 133.0 lb

## 2012-08-29 DIAGNOSIS — G35 Multiple sclerosis: Secondary | ICD-10-CM

## 2012-08-29 NOTE — Progress Notes (Signed)
[facility]  PATIENT: Devon Glenn DOB: 08-08-1955  REFERRING CLINICIAN: Hospitalist HISTORY FROM: patient (accompanied by CMA from [facility]) REASON FOR VISIT: new consult   HISTORICAL  CHIEF COMPLAINT:  Chief Complaint  Patient presents with  . Referral  . Multiple Sclerosis    HISTORY OF PRESENT ILLNESS:   57 year old male with history of multiple sclerosis, lung mass, hemoptysis, weight loss, pneumonia, here for evaluation of multiple sclerosis. Patient is accompanied by medical assistant from [facility]. Patient is not aware of the diagnosis for multiple sclerosis himself. Unfortunately he does not have a records from prior neurologist to review. Diagnosis of multiple sclerosis is present on his face sheet from nursing home. This diagnosis is also mentioned in hospital records.  Patient previously lived in [place], and moved to live with his son a few months ago. He was hospitalized in the end of March 2014 for weight loss, hemoptysis, and found to have a left lower lung mass. He is having ongoing pulmonology followup for this.  REVIEW OF SYSTEMS: Full 14 system review of systems performed and notable only for as per history of present illness.  ALLERGIES: Allergies  Allergen Reactions  . Penicillins     Unknown - tolerated Zosyn    HOME MEDICATIONS: Outpatient Prescriptions Prior to Visit  Medication Sig Dispense Refill  . feeding supplement (ENSURE COMPLETE) LIQD Take 237 mLs by mouth 3 (three) times daily with meals.  237 mL  90  . furosemide (LASIX) 20 MG tablet Take 20 mg by mouth 2 (two) times daily.      . iron polysaccharides (NIFEREX) 150 MG capsule Take 150 mg by mouth 2 (two) times daily.      Marland Kitchen omeprazole (PRILOSEC) 20 MG capsule Take 20 mg by mouth daily.      . pantoprazole (PROTONIX) 40 MG tablet Take 40 mg by mouth daily.      . potassium chloride (K-DUR) 10 MEQ tablet Take 10 mEq by mouth 2 (two)  times daily.       No facility-administered medications prior to visit.    PAST MEDICAL HISTORY: Past Medical History  Diagnosis Date  . Multiple sclerosis   . Pneumonia     bacterial  . Edema   . Anemia   . Hypertension   . Reflux esophagitis   . MRSA (methicillin resistant staph aureus) culture positive   . Hemoptysis   . Swelling, mass, or lump in chest     PAST SURGICAL HISTORY: Past Surgical History  Procedure Laterality Date  . Lipoma removal    . Video bronchoscopy N/A 08/06/2012    Procedure: VIDEO BRONCHOSCOPY WITH FLUORO;  Surgeon: Storm Frisk, MD;  Location: [facility];  Service: Cardiopulmonary;  Laterality: N/A;  . Video bronchoscopy Bilateral 08/12/2012    Procedure: VIDEO BRONCHOSCOPY WITH FLUORO;  Surgeon: Coralyn Helling, MD;  Location: [facility];  Service: Cardiopulmonary;  Laterality: Bilateral;    FAMILY HISTORY: Family History  Problem Relation Age of Onset  . Breast cancer Mother   . Cancer Father   . Breast cancer Sister     SOCIAL HISTORY:  History   Social History  . Marital Status: Single    Spouse Name: N/A    Number of Children: N/A  . Years of Education: N/A   Occupational History  . Not on file.   Social History Main Topics  . Smoking status: Former Smoker    Quit date: 06/06/2012  . Smokeless tobacco: Never  Used  . Alcohol Use: No  . Drug Use: No  . Sexually Active: No   Other Topics Concern  . Not on file   Social History Narrative  . No narrative on file     PHYSICAL EXAM  Filed Vitals:   08/29/12 1114  BP: 99/56  Pulse: 84  Temp: 98.5 F (36.9 C)  TempSrc: Oral  Height: 6\' 1"  (1.854 m)  Weight: 133 lb (60.328 kg)   Body mass index is 17.55 kg/(m^2).  GENERAL EXAM: Patient is in no distress  CARDIOVASCULAR: Regular rate and rhythm, no murmurs, no carotid bruits  NEUROLOGIC: MENTAL STATUS: awake, alert, language fluent, comprehension intact, naming intact CRANIAL NERVE: no papilledema on  fundoscopic exam, pupils equal and reactive to light, visual fields full to confrontation, extraocular muscles intact, no nystagmus, facial sensation and strength symmetric, uvula midline, shoulder shrug symmetric, tongue midline. MOTOR: HEAD TREMOR, LUE ATAXIA AND WEAKNESS. LLE WEAKNESS AND INCREASED TONE. SENSORY: ABSENT VIB AT TOES. COORDINATION: LUE ATAXIA (SEVERE). REFLEXES: BRISK IN LUE AND LLE. GAIT/STATION: SPASTIC, ATAXIC GAIT.  DIAGNOSTIC DATA (LABS, IMAGING, TESTING) - I reviewed patient records, labs, notes, testing and imaging myself where available.  Lab Results  Component Value Date   WBC 12.3* 08/13/2012   HGB 8.4* 08/13/2012   HCT 28.5* 08/13/2012   MCV 81.0 08/13/2012   PLT 643* 08/13/2012      Component Value Date/Time   NA 131* 08/10/2012 0432   K 4.0 08/10/2012 0432   CL 95* 08/10/2012 0432   CO2 30 08/10/2012 0432   GLUCOSE 83 08/10/2012 0432   BUN 20 08/10/2012 0432   CREATININE 0.74 08/10/2012 0432   CALCIUM 8.8 08/10/2012 0432   PROT 6.8 08/05/2012 0515   ALBUMIN 1.8* 08/05/2012 0515   AST 8 08/05/2012 0515   ALT <5 08/05/2012 0515   ALKPHOS 91 08/05/2012 0515   BILITOT 0.3 08/05/2012 0515   GFRNONAA >90 08/10/2012 0432   GFRAA >90 08/10/2012 0432   No results found for this basename: CHOL, HDL, LDLCALC, LDLDIRECT, TRIG, CHOLHDL   No results found for this basename: HGBA1C   Lab Results  Component Value Date   VITAMINB12 1270* 08/05/2012   No results found for this basename: TSH      ASSESSMENT AND PLAN  57 y.o. year old male  has a past medical history of Multiple sclerosis; Pneumonia; Edema; Anemia; Hypertension; Reflux esophagitis; MRSA (methicillin resistant staph aureus) culture positive; Hemoptysis; and Swelling, mass, or lump in chest. here for evaluation of multiple sclerosis. I called patient's son by phone during evaluation, unfortunately he was unable to talk to me at this time. I will try contacting him later today. I would like to request in view prior neurology records  and imaging studies, before pursuing additional testing or treatment for multiple sclerosis. Also I would like to see results of followup of his left lung mass.   Suanne Marker, MD 08/29/2012, 12:04 PM Certified in Neurology, Neurophysiology and Neuroimaging  [facility] [address] [place], [place] [address] [phone]

## 2012-08-29 NOTE — Patient Instructions (Signed)
I will request additional records from prior neurologist, before pursuing additional testing.

## 2012-09-02 ENCOUNTER — Encounter: Payer: Self-pay | Admitting: Adult Health

## 2012-09-02 ENCOUNTER — Non-Acute Institutional Stay (SKILLED_NURSING_FACILITY): Payer: BC Managed Care – PPO | Admitting: Adult Health

## 2012-09-02 DIAGNOSIS — J984 Other disorders of lung: Secondary | ICD-10-CM

## 2012-09-02 DIAGNOSIS — Y95 Nosocomial condition: Secondary | ICD-10-CM

## 2012-09-02 DIAGNOSIS — G35 Multiple sclerosis: Secondary | ICD-10-CM

## 2012-09-02 DIAGNOSIS — J189 Pneumonia, unspecified organism: Secondary | ICD-10-CM

## 2012-09-05 ENCOUNTER — Ambulatory Visit (INDEPENDENT_AMBULATORY_CARE_PROVIDER_SITE_OTHER): Payer: Medicare Other | Admitting: Adult Health

## 2012-09-05 ENCOUNTER — Encounter: Payer: Self-pay | Admitting: Adult Health

## 2012-09-05 ENCOUNTER — Ambulatory Visit (INDEPENDENT_AMBULATORY_CARE_PROVIDER_SITE_OTHER)
Admission: RE | Admit: 2012-09-05 | Discharge: 2012-09-05 | Disposition: A | Discharge status: Home / Self Care | Payer: BC Managed Care – PPO | Source: Ambulatory Visit | Attending: Adult Health | Admitting: Adult Health

## 2012-09-05 VITALS — BP 92/60 | HR 86 | Temp 97.8°F | Ht 73.0 in | Wt 134.6 lb

## 2012-09-05 DIAGNOSIS — J984 Other disorders of lung: Secondary | ICD-10-CM

## 2012-09-05 DIAGNOSIS — J189 Pneumonia, unspecified organism: Secondary | ICD-10-CM

## 2012-09-05 DIAGNOSIS — Y95 Nosocomial condition: Secondary | ICD-10-CM

## 2012-09-05 NOTE — Progress Notes (Signed)
  Subjective:    Patient ID: Devon Glenn, male    DOB: [birth year], 57 y.o.   MRN: [id number]  HPI 57 yo male NH -former smoker , with known MS admitted 08/04/12 for HC-PNA  W/ cavitating mass in LLL . Seen for initial pulmonary consult during this admit.  09/05/2012 Post Hospital follow up  Admitted 3/31 for hypoxia , wt loss and cough. CT showed large left lung mass. PCCM consulted and pt underwent FOB w/ notable copious secretion.-cytology neg for malignant cells. He was treated with aggressive pulmonary hygiene , abx therapy. Cx neg.  Speech eval w/ neg swallow test.  Discharged on Augmentin.  CXR showed improved aeration on left w/ decreased LLL infiltrate.  Since discharge pt states he is feeling better, eating better.  No hemoptysis, chest pian or edema.    SH:  Retired at age 68 from [employer]  Previously lived in [place].  Former smoker- smoked pipe  No etoh  No drugs.  No pets.  No unusual hobbies or extensive travel.  No military work.     Review of Systems Constitutional:   No  weight loss, night sweats,  Fevers, chills,  +fatigue, or  lassitude.  HEENT:   No headaches,  Difficulty swallowing,  Tooth/dental problems, or  Sore throat,                No sneezing, itching, ear ache, nasal congestion, post nasal drip,   CV:  No chest pain,  Orthopnea, PND, swelling in lower extremities, anasarca, dizziness, palpitations, syncope.   GI  No heartburn, indigestion, abdominal pain, nausea, vomiting, diarrhea, change in bowel habits, loss of appetite, bloody stools.   Resp: No coughing up of blood.  No change in color of mucus.  No wheezing.  No chest wall deformity  Skin: no rash or lesions.  GU: no dysuria, change in color of urine, no urgency or frequency.  No flank pain, no hematuria   MS:  No joint pain or swelling.   .  No back pain. +leg weakness   Psych:  No change in mood or affect. No depression or anxiety.  No memory loss.         Objective:   Physical Exam GEN: A/Ox3; pleasant , NAD, thin male in wheelchair   HEENT:  [place]/AT,  EACs-clear, TMs-wnl, NOSE-clear, THROAT-clear, no lesions, no postnasal drip or exudate noted.   NECK:  Supple w/ fair ROM; no JVD; normal carotid impulses w/o bruits; no thyromegaly or nodules palpated; no lymphadenopathy.  RESP  Clear  P & A; w/o, wheezes/ rales/ or rhonchi.no accessory muscle use, no dullness to percussion  CARD:  RRR, no m/r/g  , no peripheral edema, pulses intact, no cyanosis or clubbing.  GI:   Soft & nt; nml bowel sounds; no organomegaly or masses detected.  Musco: Warm bil, no deformities or joint swelling noted.   Neuro: alert, no focal deficits noted.    Skin: Warm, no lesions or rashes   CXR  Improving but persistent left lower lobe consolidation. Small left effusion.       Assessment & Plan:

## 2012-09-05 NOTE — Patient Instructions (Addendum)
Follow up in 3 weeks with PFT and chest xray with Dr. Craige Cotta  Continue on current regimen  Please contact office for sooner follow up if symptoms do not improve or worsen or seek emergency care

## 2012-09-05 NOTE — Assessment & Plan Note (Signed)
Plan to follow up CXR as outpt in two weeks >> then consider f/u CT chest vs PET scan with consideration for additional biopsy attempt

## 2012-09-05 NOTE — Assessment & Plan Note (Signed)
Plan to follow up CXR as outpt in two weeks >> then consider f/u CT chest vs PET scan with consideration for additional biopsy attempt  

## 2012-09-08 DIAGNOSIS — D649 Anemia, unspecified: Secondary | ICD-10-CM

## 2012-09-08 DIAGNOSIS — G35 Multiple sclerosis: Secondary | ICD-10-CM

## 2012-09-08 DIAGNOSIS — J154 Pneumonia due to other streptococci: Secondary | ICD-10-CM

## 2012-09-08 DIAGNOSIS — J159 Unspecified bacterial pneumonia: Secondary | ICD-10-CM

## 2012-09-08 NOTE — Progress Notes (Signed)
Reviewed and agree with assessment/plan.  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

## 2012-09-10 ENCOUNTER — Telehealth: Payer: Self-pay | Admitting: Pulmonary Disease

## 2012-09-10 NOTE — Telephone Encounter (Signed)
      Patient Instructions    Follow up in 3 weeks with PFT and chest xray with Dr. Craige Cotta  Continue on current regimen  Please contact office for sooner follow up if symptoms do not improve or worsen or seek emergency care  -----   I called and spoke with [facility]. She just wanted to know what TP recs were from [facility]. I advised her of this. Nothing further was needed

## 2012-10-09 ENCOUNTER — Ambulatory Visit: Payer: BC Managed Care – PPO | Admitting: Pulmonary Disease

## 2012-10-26 NOTE — Assessment & Plan Note (Signed)
Had improved, f/u bmp.

## 2012-10-26 NOTE — Assessment & Plan Note (Addendum)
Hgb in 8s.  F/u cbc.  Continue iron supplement.

## 2012-10-26 NOTE — Assessment & Plan Note (Signed)
Improved.  Thought to be due to HCAP with cavitating mass.  For f/u imaging.

## 2012-10-26 NOTE — Assessment & Plan Note (Signed)
Stable, elevate at rest.

## 2012-10-26 NOTE — Assessment & Plan Note (Signed)
Has had for several years.  Relapsing remitting.  To f/u with his neurologist.

## 2012-10-26 NOTE — Assessment & Plan Note (Signed)
With associated cavitating mass--treated with IV abx.  Now to f/u with Dr. Craige Cotta after repeat CXR to assess for resolution of mass.

## 2013-03-11 NOTE — Progress Notes (Signed)
Patient ID: Devon Glenn, male   DOB: [birth year], 57 y.o.   MRN: [id number]  [facility]  Allergies  Allergen Reactions  . Penicillins     Unknown - tolerated Zosyn    Chief Complaint  Patient presents with  . Discharge Note    HPI He is being discharged to home; he will need home health for pt/ot/nursing services. He will need wheel chair; rolling walker with ski's; tub bench; and 3:1 commode in order for him to maintain his level of independence he has gained at this facility. He will need prescriptions to be written.   Past Medical History  Diagnosis Date  . Multiple sclerosis   . Pneumonia     bacterial  . Edema   . Anemia   . Hypertension   . Reflux esophagitis   . MRSA (methicillin resistant staph aureus) culture positive   . Hemoptysis   . Swelling, mass, or lump in chest     Past Surgical History  Procedure Laterality Date  . Lipoma removal    . Video bronchoscopy N/A 08/06/2012    Procedure: VIDEO BRONCHOSCOPY WITH FLUORO;  Surgeon: Storm Frisk, MD;  Location: [facility];  Service: Cardiopulmonary;  Laterality: N/A;  . Video bronchoscopy Bilateral 08/12/2012    Procedure: VIDEO BRONCHOSCOPY WITH FLUORO;  Surgeon: Coralyn Helling, MD;  Location: [facility];  Service: Cardiopulmonary;  Laterality: Bilateral;    Filed Vitals:   09/02/12 1523  BP: 107/65  Pulse: 70    Current Outpatient Prescriptions  Medication Sig Dispense Refill  . furosemide (LASIX) 20 MG tablet Take 20 mg by mouth 2 (two) times daily.      . iron polysaccharides (NIFEREX) 150 MG capsule Take 150 mg by mouth 2 (two) times daily.      Marland Kitchen omeprazole (PRILOSEC) 20 MG capsule Take 20 mg by mouth daily.      . potassium chloride (K-DUR) 10 MEQ tablet Take 10 mEq by mouth 2 (two) times daily.      Marland Kitchen amLODipine (NORVASC) 5 MG tablet Take 5 mg by mouth daily.       . feeding supplement (ENSURE COMPLETE) LIQD Take 237 mLs by mouth 3 (three) times daily with meals.  237 mL  90     Review of  Systems  Constitutional: Negative for fever and chills.  Respiratory: Positive for cough and sputum production. Negative for shortness of breath.   Cardiovascular: Negative for chest pain.  Gastrointestinal: Negative for heartburn and abdominal pain.  Genitourinary: Negative for dysuria.  Musculoskeletal: Negative for myalgias.  Neurological: Negative for dizziness and headaches.    Physical Exam  Constitutional: He is oriented to person, place, and time.  Chronically ill appearing middle aged male  HENT:  Head: Normocephalic and atraumatic.   Cardiovascular: Normal rate, regular rhythm, normal heart sounds and intact distal pulses.   Pulmonary/Chest: Effort normal and breath sounds normal.  Abdominal: Soft. Bowel sounds are normal. He exhibits no distension and no mass. There is no tenderness.  Musculoskeletal: He exhibits no edema and no tenderness.  Neurological: He is alert and oriented to person, place, and time.  Skin: Skin is warm and dry.  Psychiatric: He has a normal mood and affect.   ASSESSMENT/PLAN  Will discharge him to home with home health for pt/ot/nursing services; will need a wheelchair; rolling walker with ski's; tub bench and 3:1 commode; prescriptions have been written  Time spent with patient 35 minutes

## 2014-08-20 IMAGING — CR DG CHEST 2V
2 series · 2 of 2 positions shown · non-contrast
Comparison: Chest radiograph ofApril 4, 8875; chest CT August 05, 2012.

CLINICAL DATA: Left pleural abscess or empyema

CHEST - 2 VIEW

[w chest lat]
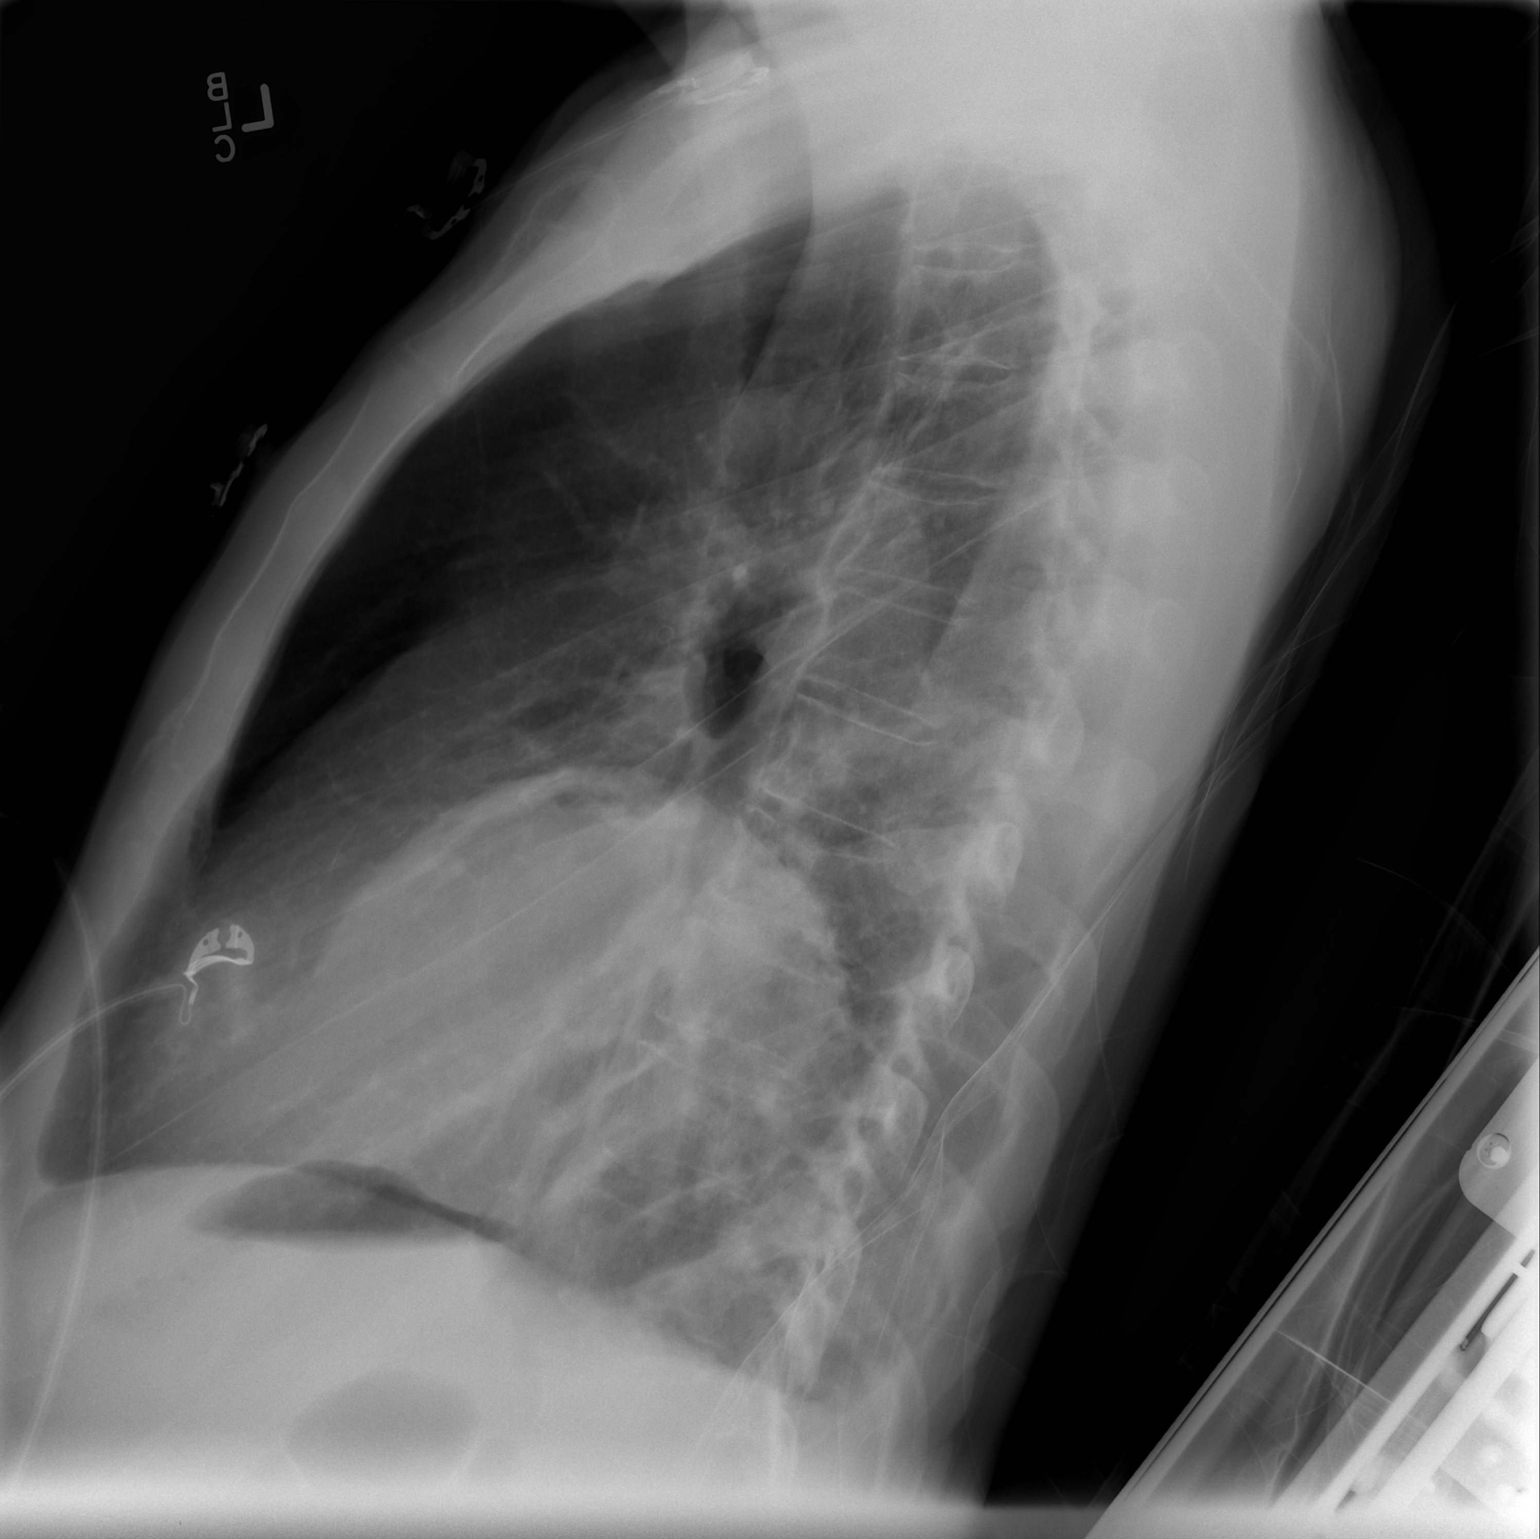

[view not recorded]
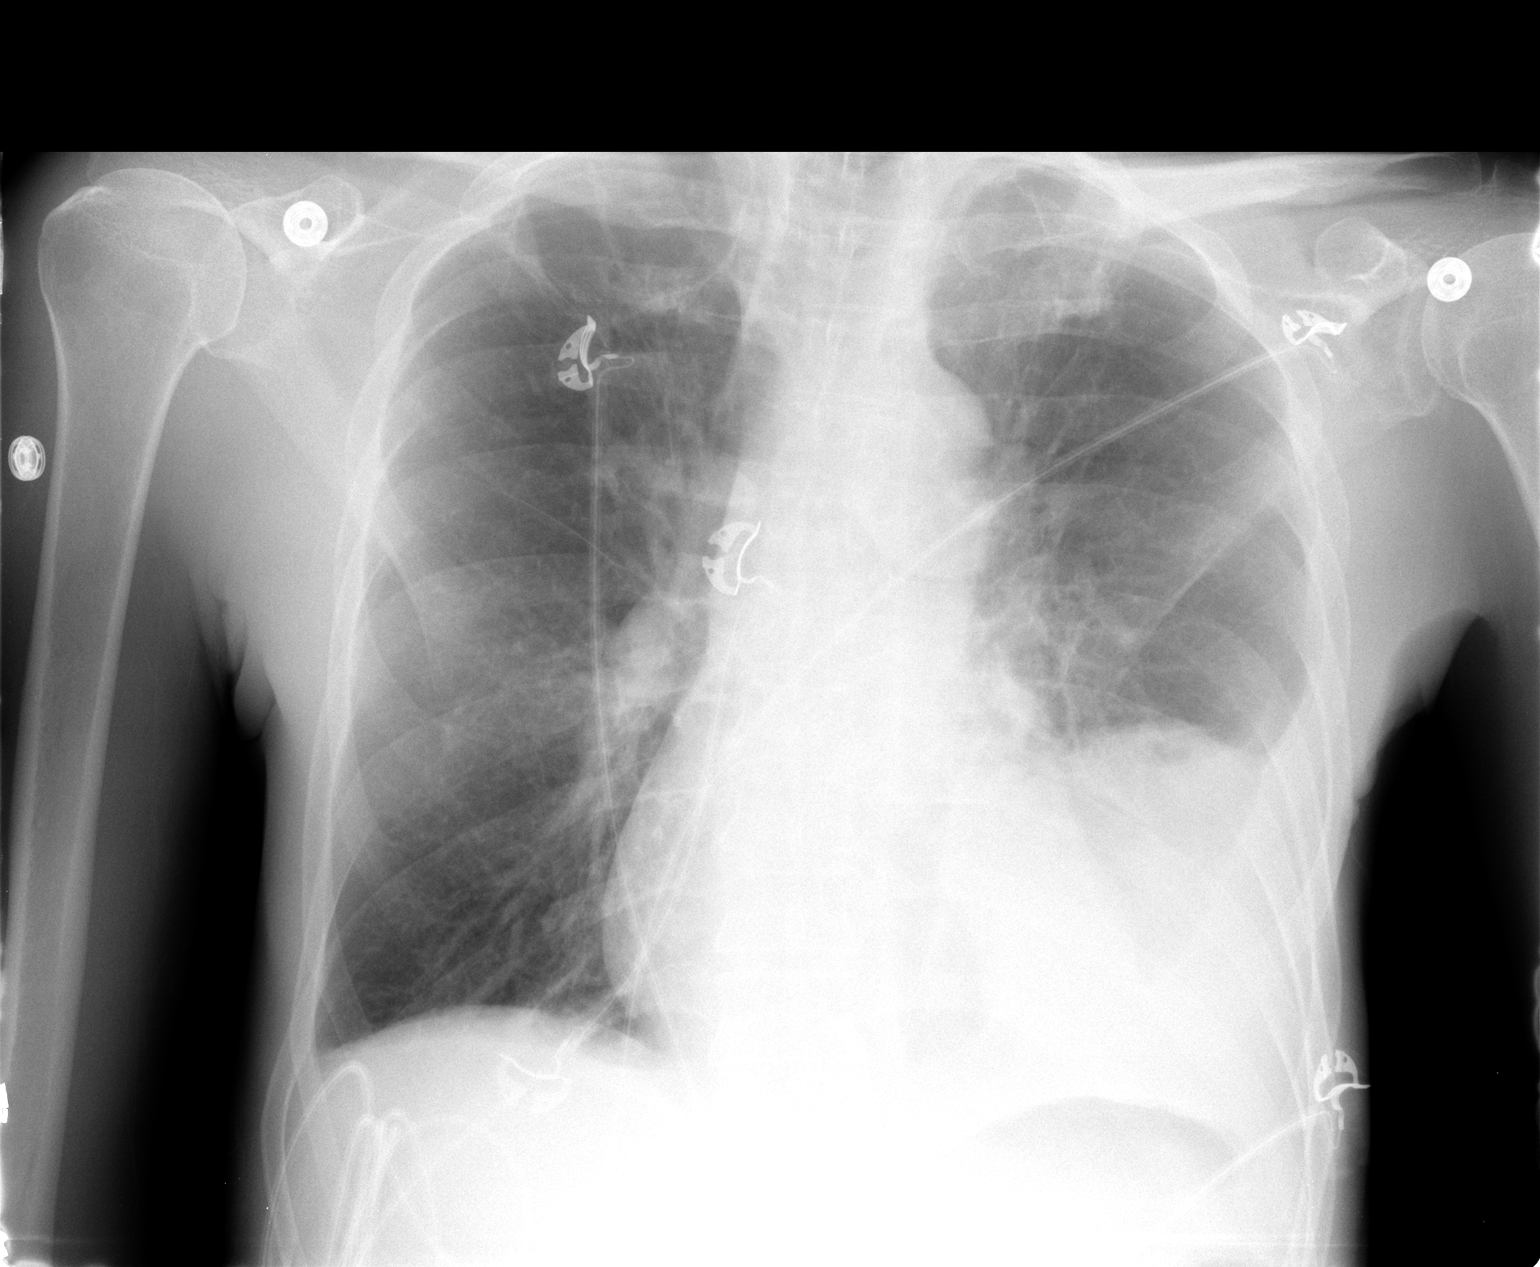

[2 of 2 positions shown; findings below may reference images not displayed]

FINDINGS: Stable mild cardiomegaly.  Right lung is clear.  Large
rounded left lower lobe mass like density is again noted is again
noted consistent with a large empyema or abscess as described on
prior studies.  Associated pleural effusion is noted as well as
probable atelectasis of left lower lobe.
IMPRESSION: No significant change seen involving large rounded opacity in left
lower lobe consistent with abscess or empyema as described on prior
studies.  Associated pleural effusion and atelectasis is noted in
left lower lobe.

## 2014-08-21 IMAGING — CR DG CHEST 2V
3 series · 3 of 3 positions shown · non-contrast
Comparison: [DATE], [DATE], and 08/04/2012 and CT scan dated
08/05/2012

CLINICAL DATA: Pneumonia.  Left pleural effusion.

CHEST - 2 VIEW

[w chest lat (1 of 2)]
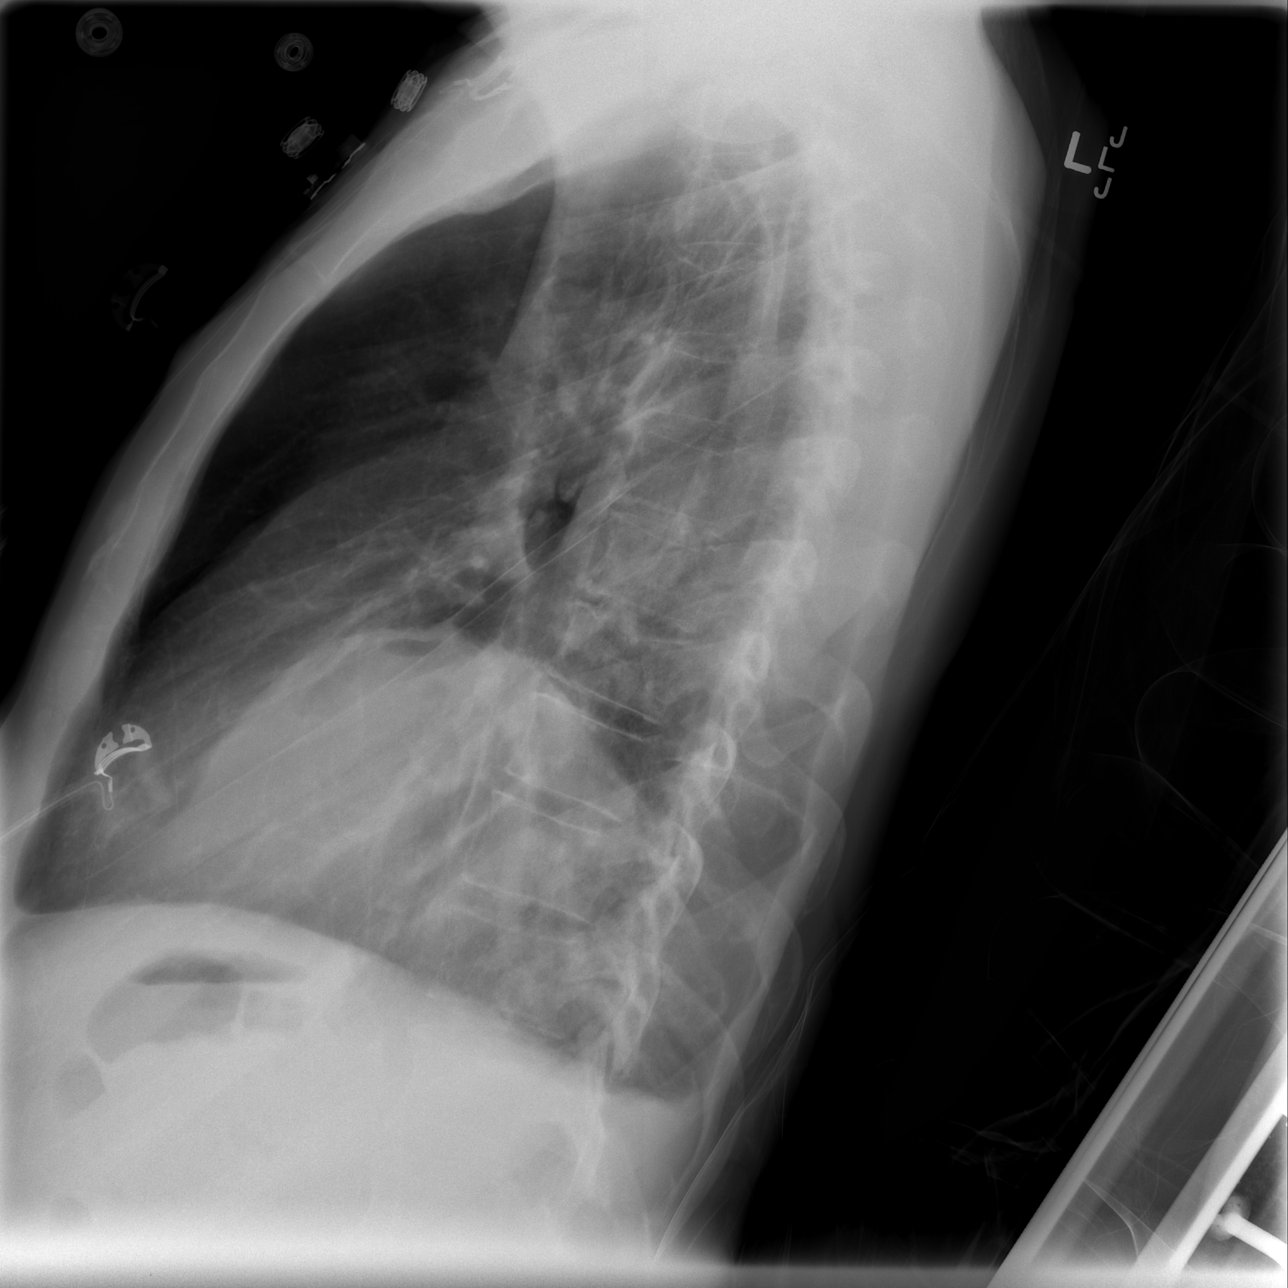

[w chest lat (2 of 2)]
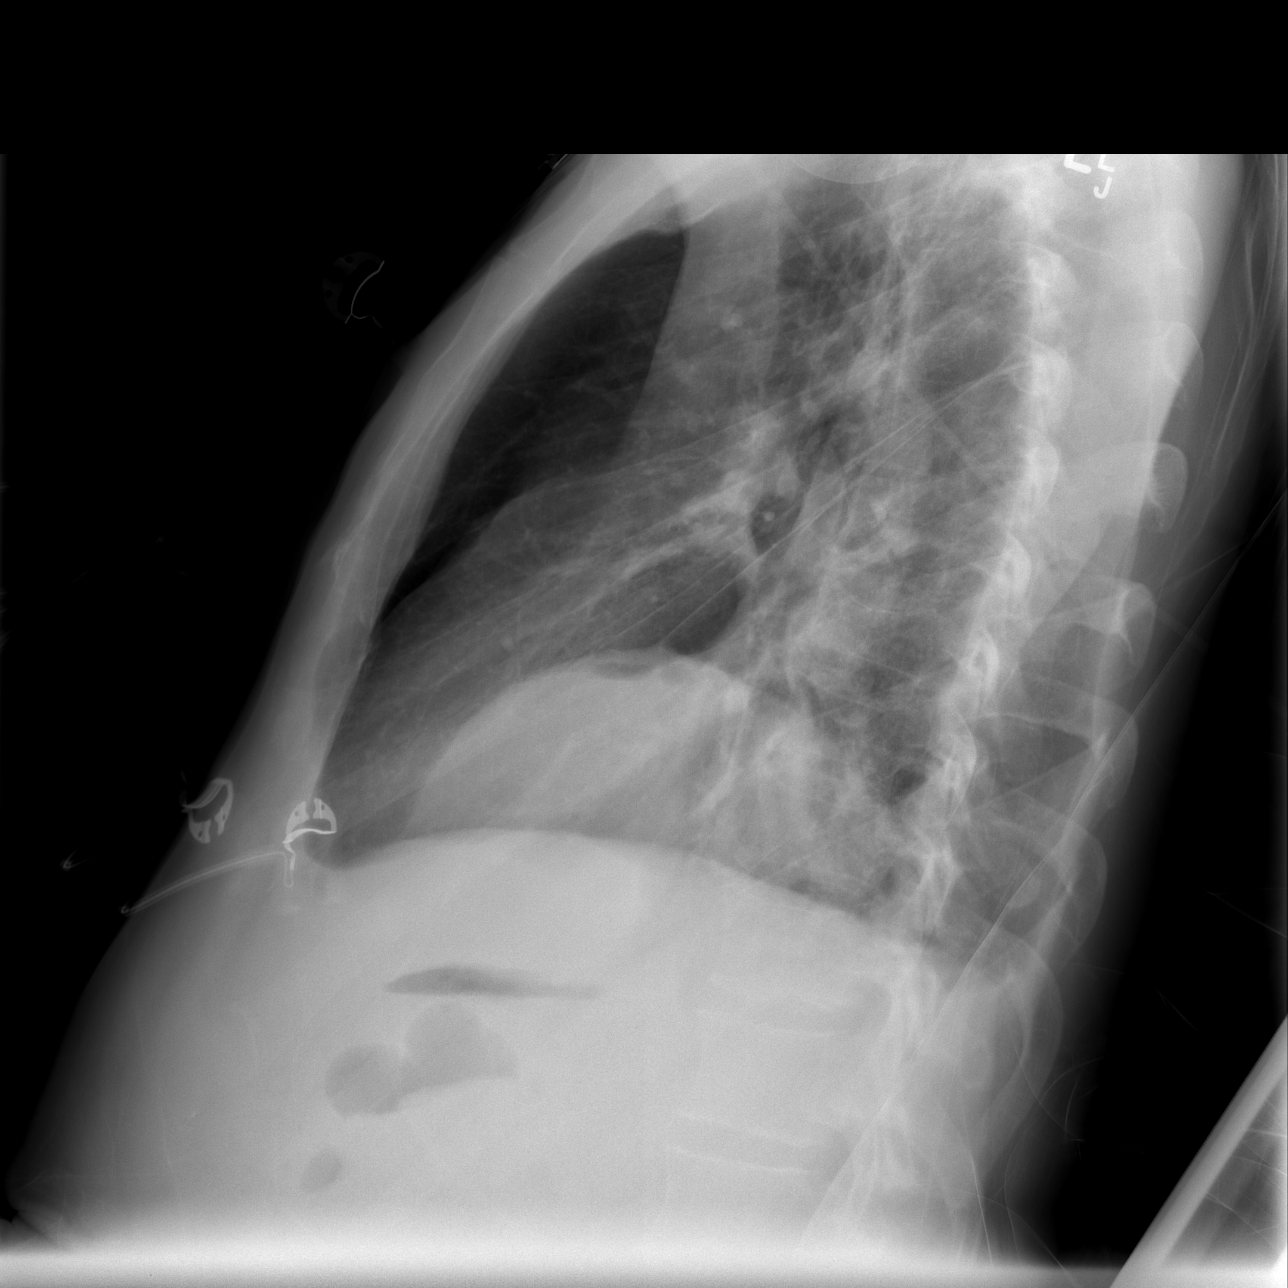

[view not recorded]
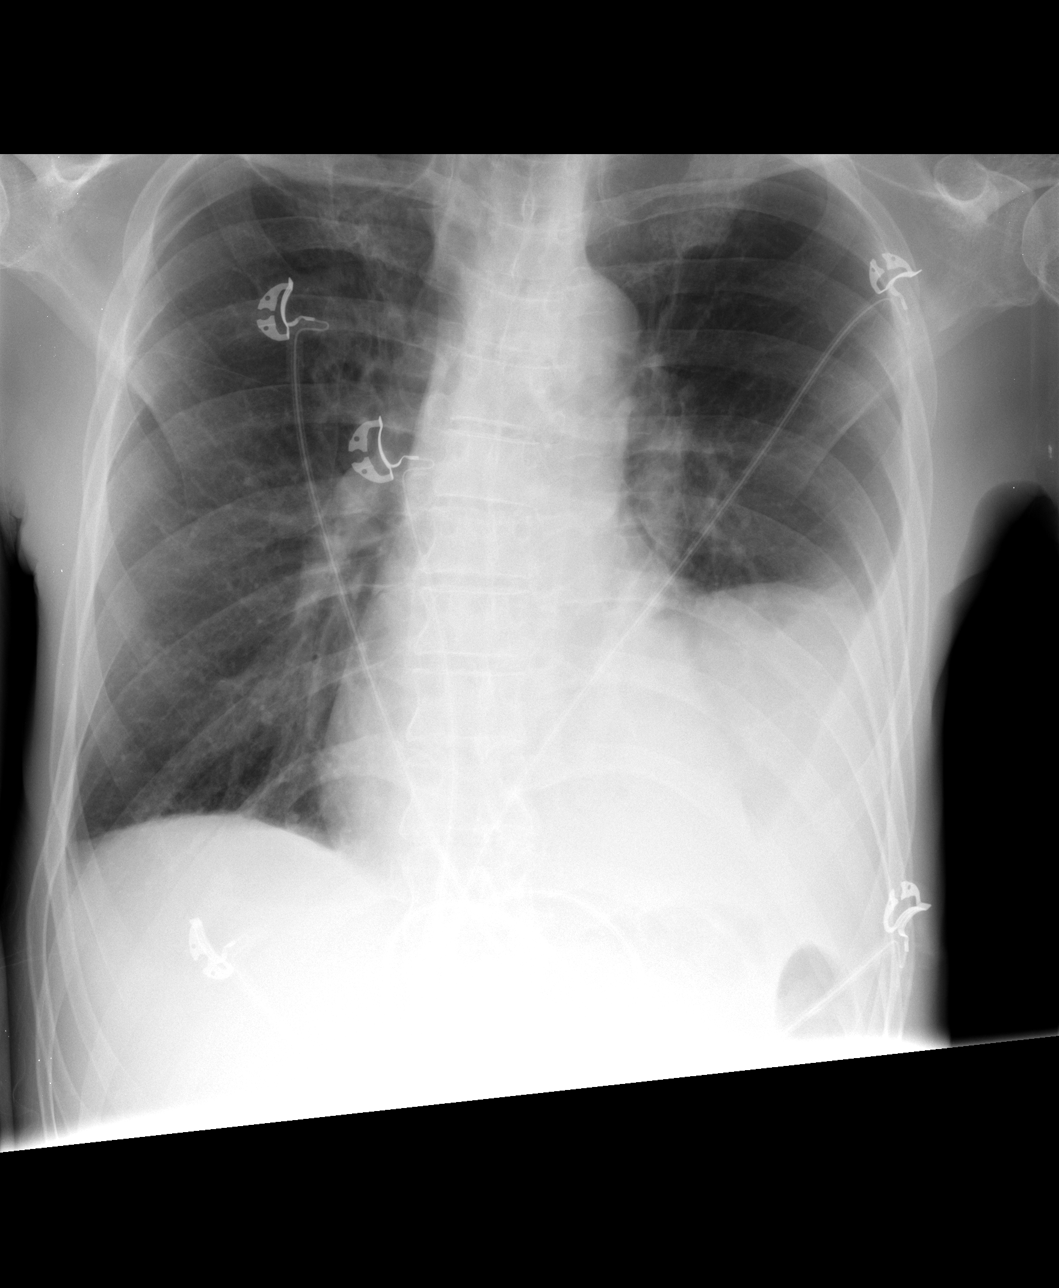

[3 of 3 positions shown; findings below may reference images not displayed]

FINDINGS: The infiltrates and atelectasis at both lung bases have
resolved.  However, the large loculated fluid collection at the
left lung base persists.  There is a small air collection in the
superior aspect of this fluid collection.

Heart size and vascularity are normal.  Blebs are noted at the lung
apices.
IMPRESSION: 1.  Resolution of the infiltrates/atelectasis at the bases.
2.  Persistent large loculated subpulmonic fluid collection at the
left lung base.

## 2017-11-26 ENCOUNTER — Ambulatory Visit: Payer: BC Managed Care – PPO | Admitting: Physical Therapy

## 2024-03-23 ENCOUNTER — Emergency Department (HOSPITAL_COMMUNITY)

## 2024-03-23 ENCOUNTER — Emergency Department (HOSPITAL_COMMUNITY)
Admission: EM | Admit: 2024-03-23 | Discharge: 2024-03-23 | Disposition: A | Discharge status: Home / Self Care | Attending: Emergency Medicine | Admitting: Emergency Medicine

## 2024-03-23 ENCOUNTER — Other Ambulatory Visit: Payer: Self-pay

## 2024-03-23 ENCOUNTER — Encounter (HOSPITAL_COMMUNITY): Payer: Self-pay | Admitting: Emergency Medicine

## 2024-03-23 DIAGNOSIS — Z79899 Other long term (current) drug therapy: Secondary | ICD-10-CM | POA: Insufficient documentation

## 2024-03-23 DIAGNOSIS — Z041 Encounter for examination and observation following transport accident: Secondary | ICD-10-CM | POA: Diagnosis present

## 2024-03-23 DIAGNOSIS — W19XXXA Unspecified fall, initial encounter: Secondary | ICD-10-CM

## 2024-03-23 DIAGNOSIS — R519 Headache, unspecified: Secondary | ICD-10-CM | POA: Diagnosis not present

## 2024-03-23 DIAGNOSIS — M6250 Muscle wasting and atrophy, not elsewhere classified, unspecified site: Secondary | ICD-10-CM | POA: Insufficient documentation

## 2024-03-23 DIAGNOSIS — I10 Essential (primary) hypertension: Secondary | ICD-10-CM | POA: Insufficient documentation

## 2024-03-23 LAB — COMPREHENSIVE METABOLIC PANEL WITH GFR
ALT: 16 U/L (ref 0–44)
AST: 49 U/L — ABNORMAL HIGH (ref 15–41)
Albumin: 3 g/dL — ABNORMAL LOW (ref 3.5–5.0)
Alkaline Phosphatase: 68 U/L (ref 38–126)
Anion gap: 13 (ref 5–15)
BUN: 8 mg/dL (ref 8–23)
CO2: 22 mmol/L (ref 22–32)
Calcium: 8.4 mg/dL — ABNORMAL LOW (ref 8.9–10.3)
Chloride: 98 mmol/L (ref 98–111)
Creatinine, Ser: 0.84 mg/dL (ref 0.61–1.24)
GFR, Estimated: >60 mL/min (ref >60)
Glucose, Bld: 106 mg/dL — ABNORMAL HIGH (ref 70–99)
Potassium: 3.4 mmol/L — ABNORMAL LOW (ref 3.5–5.1)
Sodium: 133 mmol/L — ABNORMAL LOW (ref 135–145)
Total Bilirubin: 1.3 mg/dL — ABNORMAL HIGH (ref 0.0–1.2)
Total Protein: 7 g/dL (ref 6.5–8.1)

## 2024-03-23 LAB — CBC
HCT: 41.7 % (ref 39.0–52.0)
Hemoglobin: 13 g/dL (ref 13.0–17.0)
MCH: 26.3 pg (ref 26.0–34.0)
MCHC: 31.2 g/dL (ref 30.0–36.0)
MCV: 84.2 fL (ref 80.0–100.0)
Platelets: 248 K/uL (ref 150–400)
RBC: 4.95 MIL/uL (ref 4.22–5.81)
RDW: 14.4 % (ref 11.5–15.5)
WBC: 7.9 K/uL (ref 4.0–10.5)
nRBC: 0 % (ref 0.0–0.2)

## 2024-03-23 LAB — CBG MONITORING, ED: Glucose-Capillary: 100 mg/dL — ABNORMAL HIGH (ref 70–99)

## 2024-03-23 NOTE — ED Provider Notes (Signed)
  Physical Exam  BP (!) 173/107   Pulse (!) 103   Temp 98.1 F (36.7 C) (Oral)   Resp 16   SpO2 100%   Physical Exam Vitals and nursing note reviewed.  HENT:     Head: Normocephalic and atraumatic.  Eyes:     Pupils: Pupils are equal, round, and reactive to light.  Cardiovascular:     Rate and Rhythm: Normal rate and regular rhythm.  Pulmonary:     Effort: Pulmonary effort is normal.     Breath sounds: Normal breath sounds.  Abdominal:     Palpations: Abdomen is soft.     Tenderness: There is no abdominal tenderness.  Skin:    General: Skin is warm and dry.  Neurological:     Mental Status: He is alert.  Psychiatric:        Mood and Affect: Mood normal.     Procedures  Procedures  ED Course / MDM   Clinical Course as of 03/23/24 1613  Mon Mar 23, 2024  1612 Discussed with patient's son at bedside and son over the phone.  He is back to cognitive baseline.  Appropriate for discharge back to skilled facility. [MP]    Clinical Course User Index [MP] Pamella Ozell LABOR, DO   Medical Decision Making I, Ozell Pamella DO, have assumed care of this patient from the previous provider pending discussion when patient's son arrives in the ED to ensure he is return to cognitive baseline, reevaluation and disposition  Amount and/or Complexity of Data Reviewed Labs: ordered. Radiology: ordered.          Pamella Ozell LABOR, DO 03/23/24 [phone]

## 2024-03-23 NOTE — ED Notes (Signed)
 [facility] called

## 2024-03-23 NOTE — ED Triage Notes (Addendum)
 Pt BIB [facility] from [facility].  Pt had unwitnessed fall from wheelchair.  Does not remember event or realize he was on floor on EMS arrival.  No obvious injuries or complaints.  Last seen was last night.  Ate breakfast in room around 5 am which is last remembered event by pt. Hx of HTN.  No morning meds taken. Normally a&O x4 and able to do for himself. A&O x3 at this moment, d/o to event.    RH 112 180/110 CBG 127 98% RA 20G L. AC

## 2024-03-23 NOTE — ED Notes (Signed)
 Call Night shift person at [facility] to let pt in when [facility] arrives with them. Call to let them know pt is coming.

## 2024-03-23 NOTE — ED Provider Notes (Signed)
 [place] [facility] AT [facility] Provider Note   CSN: [id number] Arrival date & time: 03/23/24  1335     Patient presents with: Devon Glenn is a 68 y.o. male.   HPI Pt BIB [facility] from [facility]. Pt had unwitnessed fall from wheelchair. Does not remember event or realize he was on floor on EMS arrival. No obvious injuries or complaints. Last seen was last night. Ate breakfast in room around 5 am which is last remembered event by pt. Hx of HTN. No morning meds taken. Normally a&O x4 and able to do for himself. A&O x3 at this moment, d/o to event.     Prior to Admission medications   Medication Sig Start Date End Date Taking? Authorizing Provider  amLODipine (NORVASC) 5 MG tablet Take 5 mg by mouth daily.  06/20/12   [provider]  feeding supplement (ENSURE COMPLETE) LIQD Take 237 mLs by mouth 3 (three) times daily with meals. 08/14/12   Gherghe, Costin M, MD  furosemide (LASIX) 20 MG tablet Take 20 mg by mouth 2 (two) times daily.    [provider]  iron  polysaccharides (NIFEREX) 150 MG capsule Take 150 mg by mouth 2 (two) times daily.    [provider]  omeprazole (PRILOSEC) 20 MG capsule Take 20 mg by mouth daily.    [provider]  potassium chloride (K-DUR) 10 MEQ tablet Take 10 mEq by mouth 2 (two) times daily.    [provider]    Allergies: Penicillins    Review of Systems  Updated Vital Signs BP (!) 173/107   Pulse (!) 103   Temp 98.1 F (36.7 C) (Oral)   Resp 16   SpO2 100%   Physical Exam Vitals and nursing note reviewed.  Constitutional:      General: He is not in acute distress.    Appearance: He is well-developed.  HENT:     Head: Normocephalic and atraumatic.  Eyes:     Conjunctiva/sclera: Conjunctivae normal.  Cardiovascular:     Rate and Rhythm: Normal rate and regular rhythm.  Pulmonary:     Effort: Pulmonary effort is normal. No respiratory  distress.     Breath sounds: No stridor.  Abdominal:     General: There is no distension.  Skin:    General: Skin is warm and dry.  Neurological:     Mental Status: He is alert and oriented to person, place, and time.     Motor: Atrophy and abnormal muscle tone present. No tremor.     (all labs ordered are listed, but only abnormal results are displayed) Labs Reviewed  COMPREHENSIVE METABOLIC PANEL WITH GFR - Abnormal; Notable for the following components:      Result Value   Sodium 133 (*)    Potassium 3.4 (*)    Glucose, Bld 106 (*)    Calcium 8.4 (*)    Albumin 3.0 (*)    AST 49 (*)    Total Bilirubin 1.3 (*)    All other components within normal limits  CBG MONITORING, ED - Abnormal; Notable for the following components:   Glucose-Capillary 100 (*)    All other components within normal limits  CBC  URINALYSIS, ROUTINE W REFLEX MICROSCOPIC    EKG: EKG Interpretation Date/Time:  Monday March 23 2024 13:49:22 EST Ventricular Rate:  105 PR Interval:  178 QRS Duration:  99 QT Interval:  334 QTC Calculation: 442 R Axis:   45  Text  Interpretation: Sinus tachycardia Left atrial enlargement RSR' in V1 or V2, right VCD or RVH Left ventricular hypertrophy Artifact in lead(s) I II III aVR aVL aVF V1 V2 V3 Confirmed by Garrick Charleston [phone]) on 03/23/2024 3:18:05 PM  Radiology: CT Cervical Spine Wo Contrast Result Date: 03/23/2024 EXAM: CT CERVICAL SPINE WITHOUT CONTRAST 03/23/2024 03:14:12 PM TECHNIQUE: CT of the cervical spine was performed without the administration of intravenous contrast. Multiplanar reformatted images are provided for review. Automated exposure control, iterative reconstruction, and/or weight based adjustment of the mA/kV was utilized to reduce the radiation dose to as low as reasonably achievable. COMPARISON: None available. CLINICAL HISTORY: Polytrauma, blunt. FINDINGS: CERVICAL SPINE: BONES AND ALIGNMENT: Straightening of the normal cervical  lordosis. Anterolisthesis of C7 on T1 likely related to degenerative changes. No acute fracture or traumatic malalignment. DEGENERATIVE CHANGES: Disc space narrowing throughout the cervical spine most pronounced at C5-C6 and C6-C7. Degenerative endplate osteophytes at multiple levels. Disc osteophyte complexes at multiple levels with mild spinal canal stenosis at C4-C5 and C5-C6. Facet arthrosis and uncovertebral hypertrophy at multiple levels. Significant foraminal stenosis throughout the cervical spine overall most pronounced on the right at C4-C5. SOFT TISSUES: No prevertebral soft tissue swelling. LUNGS: Biapical pleuroparenchymal scarring. Focal cystic change in the right lung apex. IMPRESSION: 1. No acute abnormality of the cervical spine. 2. Multilevel degenerative changes as above. Electronically signed by: Donnice Mania MD 03/23/2024 03:28 PM EST RP Workstation: [id number]   CT Head Wo Contrast Result Date: 03/23/2024 EXAM: CT HEAD WITHOUT CONTRAST 03/23/2024 03:14:12 PM TECHNIQUE: CT of the head was performed without the administration of intravenous contrast. Automated exposure control, iterative reconstruction, and/or weight based adjustment of the mA/kV was utilized to reduce the radiation dose to as low as reasonably achievable. COMPARISON: MRI brain 09/03/2023. CLINICAL HISTORY: Polytrauma, blunt. FINDINGS: BRAIN AND VENTRICLES: Moderate chronic microvascular ischemic changes versus white matter changes in the setting of multiple sclerosis. Generalized parenchymal volume loss similar to prior. Prominent cortical sulci. No acute hemorrhage. No evidence of acute infarct. No hydrocephalus. No extra-axial collection. No mass effect or midline shift. ORBITS: No acute abnormality. SINUSES: No acute abnormality. SOFT TISSUES AND SKULL: No acute soft tissue abnormality. No skull fracture. IMPRESSION: 1. No acute intracranial abnormality. Electronically signed by: Donnice Mania MD 03/23/2024 03:22 PM EST RP  Workstation: [id number]     Procedures   Medications Ordered in the ED - No data to display                                  Medical Decision Making Adult male with multiple medical problems presents after fall with a period of amnesia. Unclear what occurred, fall was unwitnessed, though the patient is wheelchair dependent, and the likely traumatic effects are minimal, given concern for the amnestic period, and substantial comorbidities, patient had labs CT monitoring. Cardiac 105 sinus tach abnormal Pulse ox 98% room air normal  Amount and/or Complexity of Data Reviewed Independent Historian: EMS External Data Reviewed: notes. Labs: ordered. Decision-making details documented in ED Course. Radiology: ordered and independent interpretation performed. Decision-making details documented in ED Course. ECG/medicine tests: ordered and independent interpretation performed. Decision-making details documented in ED Course.  Risk Prescription drug management. Decision regarding hospitalization. Diagnosis or treatment significantly limited by social determinants of health.   3:43 PM CT scan unremarkable, labs noncontributory, patient may be appropriate for discharge, we are awaiting family members for additional details.  Patient is aware  of this, agreeable.    Final diagnoses:  Fall, initial encounter    ED Discharge Orders     None          Garrick Charleston, MD 03/23/24 1547

## 2024-03-23 NOTE — Discharge Instructions (Addendum)
Your evaluation today has been largely reassuring.  But, it is important that you monitor your condition carefully, and do not hesitate to return to the ED if you develop new, or concerning changes in your condition. ° °Otherwise, please follow-up with your physician for appropriate ongoing care. ° °
